# Patient Record
Sex: Female | Born: 1945 | Race: Black or African American | Hispanic: No | Marital: Single
Health system: Southern US, Community
[De-identification: ages and names within clinical notes are randomized; demographics above are authoritative.]

## PROBLEM LIST (undated history)

## (undated) DIAGNOSIS — N184 Chronic kidney disease, stage 4 (severe): Secondary | ICD-10-CM

## (undated) DIAGNOSIS — D638 Anemia in other chronic diseases classified elsewhere: Secondary | ICD-10-CM

## (undated) DIAGNOSIS — I10 Essential (primary) hypertension: Secondary | ICD-10-CM

## (undated) DIAGNOSIS — F32A Depression, unspecified: Secondary | ICD-10-CM

## (undated) DIAGNOSIS — F329 Major depressive disorder, single episode, unspecified: Secondary | ICD-10-CM

## (undated) DIAGNOSIS — I639 Cerebral infarction, unspecified: Secondary | ICD-10-CM

## (undated) DIAGNOSIS — G40909 Epilepsy, unspecified, not intractable, without status epilepticus: Secondary | ICD-10-CM

## (undated) DIAGNOSIS — E785 Hyperlipidemia, unspecified: Secondary | ICD-10-CM

## (undated) DIAGNOSIS — F0390 Unspecified dementia without behavioral disturbance: Secondary | ICD-10-CM

## (undated) DIAGNOSIS — Z950 Presence of cardiac pacemaker: Secondary | ICD-10-CM

## (undated) DIAGNOSIS — K219 Gastro-esophageal reflux disease without esophagitis: Secondary | ICD-10-CM

## (undated) DIAGNOSIS — G309 Alzheimer's disease, unspecified: Secondary | ICD-10-CM

## (undated) DIAGNOSIS — I4891 Unspecified atrial fibrillation: Secondary | ICD-10-CM

## (undated) DIAGNOSIS — F0280 Dementia in other diseases classified elsewhere without behavioral disturbance: Secondary | ICD-10-CM

## (undated) DIAGNOSIS — S72009A Fracture of unspecified part of neck of unspecified femur, initial encounter for closed fracture: Secondary | ICD-10-CM

## (undated) DIAGNOSIS — I509 Heart failure, unspecified: Secondary | ICD-10-CM

---

## 2017-11-19 ENCOUNTER — Encounter (HOSPITAL_COMMUNITY): Payer: Self-pay | Admitting: Radiology

## 2017-11-19 ENCOUNTER — Emergency Department (HOSPITAL_COMMUNITY): Payer: Medicare Other

## 2017-11-19 ENCOUNTER — Inpatient Hospital Stay (HOSPITAL_COMMUNITY)
Admission: EM | Admit: 2017-11-19 | Discharge: 2017-11-22 | DRG: 682 | Disposition: A | Discharge status: Skilled Nursing Facility | Payer: Medicare Other | Attending: Family Medicine | Admitting: Family Medicine

## 2017-11-19 DIAGNOSIS — R1310 Dysphagia, unspecified: Secondary | ICD-10-CM | POA: Diagnosis present

## 2017-11-19 DIAGNOSIS — R29810 Facial weakness: Secondary | ICD-10-CM | POA: Diagnosis not present

## 2017-11-19 DIAGNOSIS — D61818 Other pancytopenia: Secondary | ICD-10-CM | POA: Diagnosis not present

## 2017-11-19 DIAGNOSIS — N184 Chronic kidney disease, stage 4 (severe): Secondary | ICD-10-CM | POA: Diagnosis present

## 2017-11-19 DIAGNOSIS — D631 Anemia in chronic kidney disease: Secondary | ICD-10-CM | POA: Diagnosis not present

## 2017-11-19 DIAGNOSIS — L89892 Pressure ulcer of other site, stage 2: Secondary | ICD-10-CM | POA: Diagnosis present

## 2017-11-19 DIAGNOSIS — F329 Major depressive disorder, single episode, unspecified: Secondary | ICD-10-CM | POA: Diagnosis present

## 2017-11-19 DIAGNOSIS — Z886 Allergy status to analgesic agent status: Secondary | ICD-10-CM

## 2017-11-19 DIAGNOSIS — G40909 Epilepsy, unspecified, not intractable, without status epilepticus: Secondary | ICD-10-CM | POA: Diagnosis not present

## 2017-11-19 DIAGNOSIS — R9401 Abnormal electroencephalogram [EEG]: Secondary | ICD-10-CM | POA: Diagnosis present

## 2017-11-19 DIAGNOSIS — I248 Other forms of acute ischemic heart disease: Secondary | ICD-10-CM | POA: Diagnosis not present

## 2017-11-19 DIAGNOSIS — N179 Acute kidney failure, unspecified: Secondary | ICD-10-CM | POA: Diagnosis not present

## 2017-11-19 DIAGNOSIS — I69359 Hemiplegia and hemiparesis following cerebral infarction affecting unspecified side: Secondary | ICD-10-CM

## 2017-11-19 DIAGNOSIS — D7589 Other specified diseases of blood and blood-forming organs: Secondary | ICD-10-CM | POA: Diagnosis not present

## 2017-11-19 DIAGNOSIS — Z95810 Presence of automatic (implantable) cardiac defibrillator: Secondary | ICD-10-CM

## 2017-11-19 DIAGNOSIS — E860 Dehydration: Secondary | ICD-10-CM | POA: Diagnosis present

## 2017-11-19 DIAGNOSIS — E785 Hyperlipidemia, unspecified: Secondary | ICD-10-CM | POA: Diagnosis present

## 2017-11-19 DIAGNOSIS — Z96641 Presence of right artificial hip joint: Secondary | ICD-10-CM | POA: Diagnosis present

## 2017-11-19 DIAGNOSIS — I2510 Atherosclerotic heart disease of native coronary artery without angina pectoris: Secondary | ICD-10-CM | POA: Diagnosis present

## 2017-11-19 DIAGNOSIS — F0280 Dementia in other diseases classified elsewhere without behavioral disturbance: Secondary | ICD-10-CM | POA: Diagnosis present

## 2017-11-19 DIAGNOSIS — R4182 Altered mental status, unspecified: Secondary | ICD-10-CM | POA: Diagnosis present

## 2017-11-19 DIAGNOSIS — R4701 Aphasia: Secondary | ICD-10-CM | POA: Diagnosis present

## 2017-11-19 DIAGNOSIS — S72001A Fracture of unspecified part of neck of right femur, initial encounter for closed fracture: Secondary | ICD-10-CM | POA: Diagnosis not present

## 2017-11-19 DIAGNOSIS — R410 Disorientation, unspecified: Secondary | ICD-10-CM

## 2017-11-19 DIAGNOSIS — Z79899 Other long term (current) drug therapy: Secondary | ICD-10-CM

## 2017-11-19 DIAGNOSIS — I878 Other specified disorders of veins: Secondary | ICD-10-CM | POA: Diagnosis present

## 2017-11-19 DIAGNOSIS — I482 Chronic atrial fibrillation: Secondary | ICD-10-CM | POA: Diagnosis present

## 2017-11-19 DIAGNOSIS — I5020 Unspecified systolic (congestive) heart failure: Secondary | ICD-10-CM | POA: Diagnosis present

## 2017-11-19 DIAGNOSIS — R402433 Glasgow coma scale score 3-8, at hospital admission: Secondary | ICD-10-CM | POA: Diagnosis present

## 2017-11-19 DIAGNOSIS — R29719 NIHSS score 19: Secondary | ICD-10-CM | POA: Diagnosis present

## 2017-11-19 DIAGNOSIS — G92 Toxic encephalopathy: Secondary | ICD-10-CM | POA: Diagnosis not present

## 2017-11-19 DIAGNOSIS — I130 Hypertensive heart and chronic kidney disease with heart failure and stage 1 through stage 4 chronic kidney disease, or unspecified chronic kidney disease: Secondary | ICD-10-CM | POA: Diagnosis present

## 2017-11-19 DIAGNOSIS — X58XXXA Exposure to other specified factors, initial encounter: Secondary | ICD-10-CM | POA: Diagnosis not present

## 2017-11-19 DIAGNOSIS — G309 Alzheimer's disease, unspecified: Secondary | ICD-10-CM | POA: Diagnosis present

## 2017-11-19 DIAGNOSIS — T4275XA Adverse effect of unspecified antiepileptic and sedative-hypnotic drugs, initial encounter: Secondary | ICD-10-CM | POA: Diagnosis not present

## 2017-11-19 DIAGNOSIS — R69 Illness, unspecified: Secondary | ICD-10-CM

## 2017-11-19 DIAGNOSIS — G9340 Encephalopathy, unspecified: Secondary | ICD-10-CM | POA: Diagnosis present

## 2017-11-19 HISTORY — DX: Cerebral infarction, unspecified: I63.9

## 2017-11-19 HISTORY — DX: Unspecified dementia, unspecified severity, without behavioral disturbance, psychotic disturbance, mood disturbance, and anxiety: F03.90

## 2017-11-19 HISTORY — DX: Epilepsy, unspecified, not intractable, without status epilepticus: G40.909

## 2017-11-19 HISTORY — DX: Alzheimer's disease, unspecified: G30.9

## 2017-11-19 HISTORY — DX: Chronic kidney disease, stage 4 (severe): N18.4

## 2017-11-19 HISTORY — DX: Hyperlipidemia, unspecified: E78.5

## 2017-11-19 HISTORY — DX: Heart failure, unspecified: I50.9

## 2017-11-19 HISTORY — DX: Depression, unspecified: F32.A

## 2017-11-19 HISTORY — DX: Anemia in other chronic diseases classified elsewhere: D63.8

## 2017-11-19 HISTORY — DX: Dementia in other diseases classified elsewhere, unspecified severity, without behavioral disturbance, psychotic disturbance, mood disturbance, and anxiety: F02.80

## 2017-11-19 HISTORY — DX: Essential (primary) hypertension: I10

## 2017-11-19 HISTORY — DX: Unspecified atrial fibrillation: I48.91

## 2017-11-19 HISTORY — DX: Gastro-esophageal reflux disease without esophagitis: K21.9

## 2017-11-19 HISTORY — DX: Fracture of unspecified part of neck of unspecified femur, initial encounter for closed fracture: S72.009A

## 2017-11-19 HISTORY — DX: Major depressive disorder, single episode, unspecified: F32.9

## 2017-11-19 HISTORY — DX: Presence of cardiac pacemaker: Z95.0

## 2017-11-19 LAB — URINALYSIS, ROUTINE W REFLEX MICROSCOPIC
Bilirubin Urine: NEGATIVE
GLUCOSE, UA: NEGATIVE mg/dL
HGB URINE DIPSTICK: NEGATIVE
Ketones, ur: NEGATIVE mg/dL
Leukocytes, UA: NEGATIVE
Nitrite: NEGATIVE
PROTEIN: NEGATIVE mg/dL
Specific Gravity, Urine: 1.009 (ref 1.005–1.030)
pH: 6 (ref 5.0–8.0)

## 2017-11-19 LAB — CBC
HCT: 28.1 % — ABNORMAL LOW (ref 36.0–46.0)
HEMOGLOBIN: 8.6 g/dL — AB (ref 12.0–15.0)
MCH: 31.9 pg (ref 26.0–34.0)
MCHC: 30.6 g/dL (ref 30.0–36.0)
MCV: 104.1 fL — AB (ref 78.0–100.0)
Platelets: 183 10*3/uL (ref 150–400)
RBC: 2.7 MIL/uL — ABNORMAL LOW (ref 3.87–5.11)
RDW: 17.3 % — AB (ref 11.5–15.5)
WBC: 7.5 10*3/uL (ref 4.0–10.5)

## 2017-11-19 LAB — I-STAT CG4 LACTIC ACID, ED: LACTIC ACID, VENOUS: 1.41 mmol/L (ref 0.5–1.9)

## 2017-11-19 LAB — CBG MONITORING, ED: Glucose-Capillary: 92 mg/dL (ref 65–99)

## 2017-11-19 LAB — COMPREHENSIVE METABOLIC PANEL
ALBUMIN: 2.1 g/dL — AB (ref 3.5–5.0)
ALT: 15 U/L (ref 14–54)
ANION GAP: 10 (ref 5–15)
AST: 27 U/L (ref 15–41)
Alkaline Phosphatase: 115 U/L (ref 38–126)
BILIRUBIN TOTAL: 0.9 mg/dL (ref 0.3–1.2)
BUN: 33 mg/dL — AB (ref 6–20)
CO2: 27 mmol/L (ref 22–32)
Calcium: 8.2 mg/dL — ABNORMAL LOW (ref 8.9–10.3)
Chloride: 105 mmol/L (ref 101–111)
Creatinine, Ser: 2.21 mg/dL — ABNORMAL HIGH (ref 0.44–1.00)
GFR calc Af Amer: 24 mL/min — ABNORMAL LOW (ref >60)
GFR calc non Af Amer: 21 mL/min — ABNORMAL LOW (ref >60)
GLUCOSE: 98 mg/dL (ref 65–99)
POTASSIUM: 4.9 mmol/L (ref 3.5–5.1)
Sodium: 142 mmol/L (ref 135–145)
TOTAL PROTEIN: 5.8 g/dL — AB (ref 6.5–8.1)

## 2017-11-19 LAB — I-STAT VENOUS BLOOD GAS, ED
Acid-Base Excess: 7 mmol/L — ABNORMAL HIGH (ref 0.0–2.0)
Bicarbonate: 32.5 mmol/L — ABNORMAL HIGH (ref 20.0–28.0)
O2 SAT: 68 %
TCO2: 34 mmol/L — AB (ref 22–32)
pCO2, Ven: 50.4 mmHg (ref 44.0–60.0)
pH, Ven: 7.418 (ref 7.250–7.430)
pO2, Ven: 36 mmHg (ref 32.0–45.0)

## 2017-11-19 LAB — AMMONIA: Ammonia: 20 umol/L (ref 9–35)

## 2017-11-19 LAB — PROTIME-INR
INR: 1.21
PROTHROMBIN TIME: 15.2 s (ref 11.4–15.2)

## 2017-11-19 LAB — PHOSPHORUS: Phosphorus: 5.2 mg/dL — ABNORMAL HIGH (ref 2.5–4.6)

## 2017-11-19 LAB — MAGNESIUM: Magnesium: 2.1 mg/dL (ref 1.7–2.4)

## 2017-11-19 LAB — BRAIN NATRIURETIC PEPTIDE: B NATRIURETIC PEPTIDE 5: 44.2 pg/mL (ref 0.0–100.0)

## 2017-11-19 MED ORDER — SODIUM CHLORIDE 0.9 % IV SOLN
Freq: Once | INTRAVENOUS | Status: AC
  Administered 2017-11-19: 21:00:00 via INTRAVENOUS

## 2017-11-19 MED ORDER — SODIUM CHLORIDE 0.9 % IV SOLN
500.0000 mg | Freq: Once | INTRAVENOUS | Status: AC
  Administered 2017-11-19: 500 mg via INTRAVENOUS
  Filled 2017-11-19: qty 5

## 2017-11-19 NOTE — ED Notes (Signed)
Felton ClintonEileen Weston, pt NP at [facility], called for an update on pt. States pt is normally alert and oriented to person and place, and speech is normally clear. States pt has been off of xarelto since November 01, 2017, and she is concerned for stroke. Made aware that pt has not yet been to CT and to check back in approx 90 minutes once CT should be resulted.

## 2017-11-19 NOTE — H&P (Signed)
[facility] Medicine Teaching [facility] History and Physical Service Pager: [phone]  Patient name: Alexandra Potts Medical record number: [id number] Date of birth: 12/06/1945 Age: 72 y.o. Gender: female  Primary Care Provider: Patient, No Pcp Per Consultants: Neurology Code Status: Full  Chief Complaint: Altered mental status  Assessment and Plan: Alexandra Potts is a 72 y.o. female presenting with altered mental status from her nursing home.  PMH is significant for right hip fracture status post s/p right hip arthroplasty 11/02/17, CAD, angina pectoralis, CKD 4, Alzheimer's disease, CHF w/ biventricular ICD, hypertension, pancytopenia, atrial fibrillation, pancytopenia, epilepsy, CVA of MCA with hemiplegia  Altered mental status: Patient was more confused from baseline this a.m at [facility] and was brought in by EMS to the ED for evaluation.  Labs obtained in ED, include CMP, BNP, VBG, CBC, PT/INR, UA, chest x-ray, blood cultures x2 and urine culture, CT head, EKG, lactic acid, ammonia.  VBG was non-acidotic with normal PCO2 and PO2.  CMP had elevated creatinine to 2.21, albumin 2.1, total protein 5.8, phosphorus 5.2 with remainder of CMP unremarkable.  Lactic acid was 1.4.  BNP 44.2.  CBC had WBC 7.5, hemoglobin 8.6 with MVC of 104.  PT/INR is 15.2/1.21.  Blood glucose was 98.  UA was clear and negative for ketones, nitrites, leukocytes.  Ammonia was within normal levels at 20.  CT head showed prior MCA stroke without evidence of new stroke.  EKG has significant artifact, likely due to patient's significant obesity to appreciate any interpretable waveforms.  This is consistent on repeat EKG.  Chest x-ray shows low lung volumes and pacer/defibrillator.  There is no evidence of pneumonia on read by radiology. Patient has history of stroke, Alzheimer's dementia.  It is difficult to ascertain her baseline as it is not clearly documented.  Patient is oriented to self only.  She can answer yes  or no questions, but appears global delay in mentation.  Patient's baseline is not well documented.  She has history significant for Alzheimer's, prior stroke with hemiplegia, epilepsy.  Differential diagnosis includes stroke, toxic metabolic encephalopathy, seizure, dementia.  Infection does not seem to be likely given no white count, no obvious source seen on chest x-ray or found UA.  Although no stroke was seen on CT, stroke is very possible given patient has history of A. fib and is not currently anticoagulated due to recent hip fracture.  Neurology recommended MRI versus repeat head CT in 24 hours.  MRI is not obtainable at this time as patient has an AICD in which we do not know MRI compatibility.  Cardiac etiology such as A. fib, worsening CHF, MI are possible given patient's significant cardiac history.  CHF is not likely given low BNP, patient is down 9 pounds from prior discharge on 0 1/31, lack of volume overloaded on chest x-ray.  Nevertheless, patient has very significant lower extremity edema. Cannot rule out MI based on EKG.  Endocrine etiology is also possible will obtain TSH.  Will obtain reversible dementia labs including B12, folate, RPR, HIV.  Intoxication is also possible, will obtain ethanol levels, and UDS. Leading differentials include stroke vs seizure; less likely infectious etiology.  -Admit to family medicine teaching service, attending Dr. Pollie MeyerMcIntyre - Vitals per floor protocol -Neurology consulted, appreciate recommendations -Telemetry -Echocardiogram -Stratification labs: A1c, cholesterol -TSH -B12, folate -RPR, HIV -Blood cultures, urine cultures pending -IV Keppra 250 mg every 12 hours, convert back to p.o. when passes swallow study -Restart Vimpat 100 mg twice daily when patient  can tolerate p.o. -EEG in the a.m. -Follow-up with primary care physician to determine type of ICD ([employer]'s?) -MRI versus CT pending determining type of ICD compatibility -Daily  weights -Strict I's and O's -Continuous pulse ox -Neurochecks every 2 for 12 hours, then q.4 hours -Vital signs per floor -PT/OT/speech -Troponin, repeat EKG in a.m. -Morning BMP, CBC -Ethanol, UDS -Hold sedating meds while altered - Can try and touch base with [facility] nursing facility where patient came from tomorrow to get an idea of baseline mental status  Right hip fracture of right femoral neck: Status post s/p right hip arthroplasty 11/02/17, Discharge from hospital with non weight bearing and orthotic.Patient was to complete 7 day course of doxycycline for drainage from incision site.  Not on current med list.  Unsure if patient finished.  Patient was anticoagulated with Xarelto for atrial fibrillation, however this was discontinued upon discharge of her last hospitalization until she was cleared by outpatient orthopedics. On percocet at home 5-3 25 -Consider consult orthopedics vs waiting to follow up outpatient -Consult [employer] to repeat position patient in custom brace.   -Continue percocet 5 mg BID PRN  AKI on CKD stage: Likely due to worsening cardiorenal syndrome due to CHF, may be worse due to stress of current illness question. Additionally could be prerenal etiology from dehydration (weight is down 9 lbs from January). Cr 2.27 on admission, was 1.31 on 1/31. Given gentle fluid hydration and ED of 700 cc. -continue to monitor BUN and creatinine -restart torsemide  -Holding IV fluids for now  Systolic CHF : Echocardiogram on 10/19/17shows EF 51%.  BNP on admission does not support active exacerbation.  At home, patient is on on torsemide 40 mg, carvedilol 12.5 mg twice daily, weight on discharge was 372 pounds. Weight on admission is 363.  Patient is down 9 pounds -Restart torsemide and carvedilol  -strict I's and O's, daily weights  Hypertension, chronic: Uncontrolled on admission to 143/128. Takes amlodipine 5 mg daily, hydralazine 12.5 mg twice daily, lisinopril 2.5  mg -Resume home medications  History of dysphagia: Previously eval by speech pathology and advanced to chopped diet.  Given patient's AMS.   -Will consult speech further recommendations. - Dysphagia 1 diet for now if she passes stroke swallow screen  Epilepsy, chronic:Takes Keppra 250 mg twice daily, Vimpat 100 mg twice daily. S/p Keppra load per neurology -IV Keppra as above until passes stroke swallow screen and can change to PO - Continue Vimpat 100 mg BID - Neurology consulted as above  A fib, chronic, s/p pacer/AICD:  -Rate control amiodarone 200 mg.   -Holding anticoagulation with Xarelto 15 mg daily  CAD-chronic:,  -Consider increasing atorvastatin to 40 mg -Continue with statin and Imdur   Hx of CVAwith residual hemiplegia. -Has aspirin allergy.  We will not initiate.  Will hold Xarelto as above. -Home atorvastatin 20 mg,consider increasing  Chronic anemia with pancytopenia: Hemoglobin on admission was 8.6.  Hemoglobin 7.8 2/6.  Patient had ALBA status post surgery.  This was likely contributing to current low.  Patient has macrocytosis on admission.  Also has history of iron deficiency anemia.  She is on 325 mg of iron daily at home.  Consider anemia panel this hospitalization.  Obtaining B12 and folate.  -Trend hemoglobin and platelets  - Continue home iron - Transfusion threshold hgb <8 due to CAD   History of pressure ulcerinner thigh RLE, POA, stage 1-2. Does not observe pressure ulcer on lower extremities.  Lower extremities are consistent with  significant chronic venous stasis -Wound care consult  FEN/GI: N.p.o. Prophylaxis: Lovenox for DVT prophylaxis  Disposition: Inpatient admission pending medical improvement  History of Present Illness:  Alexandra Potts is a 72 y.o. female presenting with AMS from her nursing home at [facility]. Patient does not know why she is in the ED. Patient does not provide very much history secondary to poor speech Per report from  EMS, patient was more confused from baseline this a.m. She was brought into the ED by EMS.  On presentation, patient was not oriented to self or place.  She was mildly hypotensive at 132/99 so gentle fluid resuscitation was started.  Otherwise, vitals were stable.  Broad workup for altered mental status was initiated in the ED looking at cardiac, infectious, CNS etiologies.  Neurology was also consulted.  Given patient's history of seizure, neurology recommended loading patient with IV Keppra 500 mg.  Later evaluation patient by ED and by our team,   Review Of Systems: Per HPI with the following additions: Denies pain.  Review of Systems  Unable to perform ROS: Other (Altered mental status)    Patient Active Problem List   Diagnosis Date Noted  . Altered mental status 11/19/2017    Past Medical History: Past Medical History:  Diagnosis Date  . Alzheimer's disease   . Anemia of chronic disease   . Atrial fibrillation (HCC)   . CHF (congestive heart failure) (HCC)   . CKD (chronic kidney disease), stage IV (HCC)   . Dementia   . Depression   . Epilepsy (HCC)   . GERD (gastroesophageal reflux disease)   . Hip fracture (HCC)   . Hyperlipidemia   . Hypertension   . Pacemaker   . Stroke [facility])     Past Surgical History: History of implanted pacemaker/defibrillator History of right femur fixation  Social History: Social History   Tobacco Use  . Smoking status: Not on file  Substance Use Topics  . Alcohol use: Not on file  . Drug use: Not on file   Additional social history: Lives at [facility] nursing home. Please also refer to relevant sections of EMR.  Family History: No family history on file. Unable to obtain due to exam  Allergies and Medications: Allergies  Allergen Reactions  . Aspirin Other (See Comments)    Unknown reaction - listed on MAR   No current facility-administered medications on file prior to encounter.    Current Outpatient Medications on File  Prior to Encounter  Medication Sig Dispense Refill  . acetaminophen (TYLENOL) 325 MG tablet Take 650 mg by mouth every 4 (four) hours as needed for fever (pain).    . Amino Acids-Protein Hydrolys (FEEDING SUPPLEMENT, PRO-STAT SUGAR FREE 64,) LIQD Take 30 mLs by mouth 2 (two) times daily with a meal.     . amiodarone (PACERONE) 200 MG tablet Take 200 mg by mouth 2 (two) times daily.    Marland Kitchen amLODipine (NORVASC) 5 MG tablet Take 5 mg by mouth daily.    Marland Kitchen atorvastatin (LIPITOR) 20 MG tablet Take 20 mg by mouth at bedtime.    . bisacodyl (DULCOLAX) 5 MG EC tablet Take 10 mg by mouth daily as needed (constipation).    . Calcium Carbonate-Vitamin D3 (CALCIUM 600-D) 600-400 MG-UNIT TABS Take 1 tablet by mouth 2 (two) times daily.    . carvedilol (COREG) 12.5 MG tablet Take 12.5 mg by mouth 2 (two) times daily with a meal.    . Cholecalciferol (VITAMIN D3 ULTRA POTENCY) 78469 units TABS  Take 50,000 Units by mouth every 30 (thirty) days. On or about the 5th of each month    . Coenzyme Q10 (COQ10) 50 MG CAPS Take 50 mg by mouth at bedtime.     . docusate sodium (COLACE) 100 MG capsule Take 100 mg by mouth 2 (two) times daily.    . ferrous sulfate 325 (65 FE) MG tablet Take 325 mg by mouth daily.    . hydrALAZINE (APRESOLINE) 25 MG tablet Take 12.5 mg by mouth 2 (two) times daily.    . isosorbide dinitrate (ISORDIL) 20 MG tablet Take 20 mg by mouth 3 (three) times daily.    . Lacosamide (VIMPAT) 100 MG TABS Take 100 mg by mouth 2 (two) times daily.    Marland Kitchen levETIRAcetam (KEPPRA) 250 MG tablet Take 250 mg by mouth 2 (two) times daily. For epilepsy    . lisinopril (PRINIVIL,ZESTRIL) 2.5 MG tablet Take 2.5 mg by mouth daily.    . magnesium oxide (MAG-OX) 400 MG tablet Take 400 mg by mouth at bedtime.    . Multiple Vitamin (MULTIVITAMIN WITH MINERALS) TABS tablet Take 1 tablet by mouth daily.    . nitroGLYCERIN (NITROSTAT) 0.4 MG SL tablet Place 0.4 mg under the tongue every 5 (five) minutes as needed for chest pain  (max 3 doses, if no relief call MD).    Marland Kitchen oxyCODONE-acetaminophen (PERCOCET/ROXICET) 5-325 MG tablet Take 1 tablet by mouth See admin instructions. Give 1 tablet by mouth twice daily for pain management for two weeks - 11/15/17 - 11/29/17    . Petrolatum-Zinc Oxide (PHYTOPLEX Z-GUARD) 57-17 % PSTE Apply 1 application topically See admin instructions. Apply to butt, bilateral thighs topically every day and evening shift for abrasions. Cleanse buttocks and bilateral thighs. Pat Dry. Apply Z-guard to these areas. Do this until healed, then D/C.    Marland Kitchen Polyethyl Glycol-Propyl Glycol (SYSTANE) 0.4-0.3 % SOLN Place 1 drop into both eyes daily.    . potassium chloride SA (K-DUR,KLOR-CON) 20 MEQ tablet Take 20 mEq by mouth daily.    . pregabalin (LYRICA) 25 MG capsule Take 25 mg by mouth daily.    . sertraline (ZOLOFT) 25 MG tablet Take 25 mg by mouth daily.    . Skin Protectants, Misc. (EUCERIN) cream Apply 1 application topically See admin instructions. Apply to RLE and LLE topically every day shift for dry skin    . torsemide (DEMADEX) 20 MG tablet Take 40 mg by mouth daily.    . vitamin C (ASCORBIC ACID) 500 MG tablet Take 500 mg by mouth daily.      Objective: BP (!) 143/128 (BP Location: Left Arm)   Pulse 62   Temp 98.6 F (37 C) (Oral)   Resp 16   Wt (!) 363 lb 15.7 oz (165.1 kg)   SpO2 100%  Exam: General: Alert, appears confused, morbid obesity Eyes: Pupils appear myopic, reactive to light, extraocular motions appear intact although unable to follow exam Neck: No JVD, supple Cardiovascular: Distant heart sounds, regular rate, 2-3+ pitting edema from knees down, 1+ edema in bilateral thighs, cannot palpate DP pulses but can hear on doppler in ED Respiratory: Normal work of breathing, lungs clear to auscultation bilaterally Gastrointestinal: Large central pannus, no tenderness appreciated on exam MSK: Moves upper extremities spontaneously. right leg feels mildly warm compared to left extremity,  chronic venous stasis with woody skin changes, no observed open lesions Neuro: Oriented to self, able to answer yes or no, 5/5 strength in upper extremities, cannot elicit sensation in  lower extremities cannot elicit patient to cooperate with exam lower extremities, appears to have history of hemiplegia from prior stroke as documented in chart, mild tremor head  Labs and Imaging: CBC BMET  Recent Labs  Lab 11/20/17 0135  WBC 8.0  HGB 9.0*  HCT 30.4*  PLT 170   Recent Labs  Lab 11/19/17 1455  NA 142  K 4.9  CL 105  CO2 27  BUN 33*  CREATININE 2.21*  GLUCOSE 98  CALCIUM 8.2Garnette Gunner, MD 11/20/2017, 3:17 AM PGY-1, [place] FPTS Intern pager: [phone], text pages welcome  I have separately seen and examined the patient. I have discussed the findings and exam with Dr. Janee Morn and agree with the above note.  My changes/additions are outlined in BLUE.   Anders Simmonds, MD [facility], PGY-3

## 2017-11-19 NOTE — Consult Note (Signed)
Neurology Consultation  Reason for Consult: Altered mental status Referring Physician: Dr. Clarice Pole  CC: Altered mental status  History is obtained from: Chart review  HPI: Alexandra Potts is a 72 y.o. female who has a past medical history of old strokes with unclear residual deficits, closed displaced fracture of the right femur, dementia, hypertension, atrial fibrillation for which she was on Xarelto that was on hold for her hip surgery, biventricular AICD placement, who was brought in from a facility for increased altered mental status.  According to chart review, there is unclear documentation of patient's baseline but she was possibly verbal, not oriented but now became increasingly confused with garbled speech. Upon arrival at [facility], she was hypotensive, received a 700 cc normal saline bolus and her blood pressure was then recorded to be 122/83.  Recent hip replacement done on 11/12/2017. There is another note in the chart that says that 1 of the providers called the facility for update which said that patient is normally alert and oriented to person and place and her speech is normally clear.  She has been off of Xarelto since November 01, 2017.  She has a stroke in the past but no documentation of residual deficits. Of note, patient is on Keppra and Vimpat.  Keppra 250 twice daily and Vimpat 100 twice daily.  Unclear of her seizure semiology. No witnessed seizure activity per chart review.  She is currently not exhibiting any clinical tonic-clonic either generalized or focal seizure activity.  LKW: Greater than 24 hours from presentation tpa given?: no, outside the window Premorbid modified Rankin scale (mRS):5  ROS: Unable to obtain due to altered mental status.   Past medical history-based on chart review Congestive heart failure-unspecified Chronic renal impairment stage IV Coronary artery disease Biventricular ICD placement Hypertension Stroke with unknown  residual deficits Seizure Dementia  No family history on file-patient unable to provide  Social History:   has no tobacco, alcohol, and drug history on file. Patient unable to provide  Medications No current facility-administered medications for this encounter.   Current Outpatient Medications:  .  acetaminophen (TYLENOL) 325 MG tablet, Take 650 mg by mouth every 4 (four) hours as needed for fever (pain)., Disp: , Rfl:  .  Amino Acids-Protein Hydrolys (FEEDING SUPPLEMENT, PRO-STAT SUGAR FREE 64,) LIQD, Take 30 mLs by mouth 2 (two) times daily with a meal. , Disp: , Rfl:  .  amiodarone (PACERONE) 200 MG tablet, Take 200 mg by mouth 2 (two) times daily., Disp: , Rfl:  .  amLODipine (NORVASC) 5 MG tablet, Take 5 mg by mouth daily., Disp: , Rfl:  .  atorvastatin (LIPITOR) 20 MG tablet, Take 20 mg by mouth at bedtime., Disp: , Rfl:  .  bisacodyl (DULCOLAX) 5 MG EC tablet, Take 10 mg by mouth daily as needed (constipation)., Disp: , Rfl:  .  Calcium Carbonate-Vitamin D3 (CALCIUM 600-D) 600-400 MG-UNIT TABS, Take 1 tablet by mouth 2 (two) times daily., Disp: , Rfl:  .  carvedilol (COREG) 12.5 MG tablet, Take 12.5 mg by mouth 2 (two) times daily with a meal., Disp: , Rfl:  .  Cholecalciferol (VITAMIN D3 ULTRA POTENCY) 16109 units TABS, Take 50,000 Units by mouth every 30 (thirty) days. On or about the 5th of each month, Disp: , Rfl:  .  Coenzyme Q10 (COQ10) 50 MG CAPS, Take 50 mg by mouth at bedtime. , Disp: , Rfl:  .  docusate sodium (COLACE) 100 MG capsule, Take 100 mg by mouth 2 (  two) times daily., Disp: , Rfl:  .  ferrous sulfate 325 (65 FE) MG tablet, Take 325 mg by mouth daily., Disp: , Rfl:  .  hydrALAZINE (APRESOLINE) 25 MG tablet, Take 12.5 mg by mouth 2 (two) times daily., Disp: , Rfl:  .  isosorbide dinitrate (ISORDIL) 20 MG tablet, Take 20 mg by mouth 3 (three) times daily., Disp: , Rfl:  .  Lacosamide (VIMPAT) 100 MG TABS, Take 100 mg by mouth 2 (two) times daily., Disp: , Rfl:  .   levETIRAcetam (KEPPRA) 250 MG tablet, Take 250 mg by mouth 2 (two) times daily. For epilepsy, Disp: , Rfl:  .  lisinopril (PRINIVIL,ZESTRIL) 2.5 MG tablet, Take 2.5 mg by mouth daily., Disp: , Rfl:  .  magnesium oxide (MAG-OX) 400 MG tablet, Take 400 mg by mouth at bedtime., Disp: , Rfl:  .  Multiple Vitamin (MULTIVITAMIN WITH MINERALS) TABS tablet, Take 1 tablet by mouth daily., Disp: , Rfl:  .  nitroGLYCERIN (NITROSTAT) 0.4 MG SL tablet, Place 0.4 mg under the tongue every 5 (five) minutes as needed for chest pain (max 3 doses, if no relief call MD)., Disp: , Rfl:  .  oxyCODONE-acetaminophen (PERCOCET/ROXICET) 5-325 MG tablet, Take 1 tablet by mouth See admin instructions. Give 1 tablet by mouth twice daily for pain management for two weeks - 11/15/17 - 11/29/17, Disp: , Rfl:  .  Petrolatum-Zinc Oxide (PHYTOPLEX Z-GUARD) 57-17 % PSTE, Apply 1 application topically See admin instructions. Apply to butt, bilateral thighs topically every day and evening shift for abrasions. Cleanse buttocks and bilateral thighs. Pat Dry. Apply Z-guard to these areas. Do this until healed, then D/C., Disp: , Rfl:  .  Polyethyl Glycol-Propyl Glycol (SYSTANE) 0.4-0.3 % SOLN, Place 1 drop into both eyes daily., Disp: , Rfl:  .  potassium chloride SA (K-DUR,KLOR-CON) 20 MEQ tablet, Take 20 mEq by mouth daily., Disp: , Rfl:  .  pregabalin (LYRICA) 25 MG capsule, Take 25 mg by mouth daily., Disp: , Rfl:  .  sertraline (ZOLOFT) 25 MG tablet, Take 25 mg by mouth daily., Disp: , Rfl:  .  Skin Protectants, Misc. (EUCERIN) cream, Apply 1 application topically See admin instructions. Apply to RLE and LLE topically every day shift for dry skin, Disp: , Rfl:  .  torsemide (DEMADEX) 20 MG tablet, Take 40 mg by mouth daily., Disp: , Rfl:  .  vitamin C (ASCORBIC ACID) 500 MG tablet, Take 500 mg by mouth daily., Disp: , Rfl:   Exam: Current vital signs: BP 137/76   Pulse 64   Resp 14   SpO2 100%  Vital signs in last 24 hours: Pulse  Rate:  [64-68] 64 (02/08 1830) Resp:  [9-18] 14 (02/08 1830) BP: (98-137)/(75-99) 137/76 (02/08 1830) SpO2:  [100 %] 100 % (02/08 1830) General: Morbidly obese, not very well kempt, is awake, alert appears in no distress HEENT: Normal cephalic, atraumatic, dry mucous membranes No thyromegaly Lungs: Distant breath sounds but moving air symmetrically bilaterally Cardia vascular: S1-S2 heard regular rate rhythm no murmur rub gallop Musculoskeletal: Extensively puckered skin in both lower extremities with extensive discoloration distally, 2+ pitting edema bilaterally Abdomen: Obese, nontender on palpation Neurological exam patient is awake, appears alert, does not follow any commands. Her speech is completely incomprehensible Cranial nerves: Pupils equal round reactive to light, tracks in all directions with no gaze restriction, right lower facial weakness observed at rest, does not blink to threat from either side Motor exam: Appears to be moving both upper extremities  to noxious stimulus.  Does not move lower extremities at all Sensory exam: As above Coordination and gait exam cannot be performed due to patient's mentation Able to elicit any deep tendon reflexes NIHSS 1a Level of Conscious.: 0 1b LOC Questions: 2 1c LOC Commands: 2 2 Best Gaze: 0 3 Visual: 0 4 Facial Palsy: 1 5a Motor Arm - left: 2 5b Motor Arm - Right: 2 6a Motor Leg - Left: 3 6b Motor Leg - Right: 3 7 Limb Ataxia: 0 8 Sensory: 0 9 Best Language: 2 10 Dysarthria: 2 11 Extinct. and Inatten.: 0 TOTAL: 19  Labs I have reviewed labs in epic and the results pertinent to this consultation are: CBC    Component Value Date/Time   WBC 7.5 11/19/2017 1455   RBC 2.70 (L) 11/19/2017 1455   HGB 8.6 (L) 11/19/2017 1455   HCT 28.1 (L) 11/19/2017 1455   PLT 183 11/19/2017 1455   MCV 104.1 (H) 11/19/2017 1455   MCH 31.9 11/19/2017 1455   MCHC 30.6 11/19/2017 1455   RDW 17.3 (H) 11/19/2017 1455    CMP     Component  Value Date/Time   NA 142 11/19/2017 1455   K 4.9 11/19/2017 1455   CL 105 11/19/2017 1455   CO2 27 11/19/2017 1455   GLUCOSE 98 11/19/2017 1455   BUN 33 (H) 11/19/2017 1455   CREATININE 2.21 (H) 11/19/2017 1455   CALCIUM 8.2 (L) 11/19/2017 1455   PROT 5.8 (L) 11/19/2017 1455   ALBUMIN 2.1 (L) 11/19/2017 1455   AST 27 11/19/2017 1455   ALT 15 11/19/2017 1455   ALKPHOS 115 11/19/2017 1455   BILITOT 0.9 11/19/2017 1455   GFRNONAA 21 (L) 11/19/2017 1455   GFRAA 24 (L) 11/19/2017 1455  Magnesium 2.1, phosphorus 5.2, ammonia 20. Based on chart review from an office visit note from 10/19/2016, baseline creatinine around 1.1 Today the creatinine is 2.21  Imaging I have reviewed the images obtained: CT-scan of the brain-no acute changes.  Large areas of encephalomalacia probably reflective of an old stroke in the left MCA territory. Chest x-ray not revealing of pneumonia per official radiology report  Assessment:  72 year old woman brought in from a nursing facility for increased altered mental status over the past day or 2. No clear time of last known well.  She is clearly aphasic on exam. Due to her body habitus, it is very difficult to ascertain if she has any focal weakness but she does have right facial droop as well. Her head CT scan shows an old stroke affecting the left MCA territory. Her symptoms currently could reflect a new stroke versus recrudescence of old symptoms in the setting of current toxic metabolic derangements-creatinine 2.21. Another differential to consider is ongoing seizure activity as she is currently on Keppra and Vimpat for seizures. Not much in terms of her neurological history is available from the chart. She is not a candidate for IV TPA due to unknown last known normal or outside the window. She is not a candidate for endovascular treatment based on her modified Rankin score and no clear last known normal.  Impression: Evaluate for toxic metabolic  encephalopathy Evaluate for stroke Evaluate for seizures  Recommendations: I would recommend obtaining an MRI of the brain when possible and if the AICD is compatible. If the MRI is not possible, repeat a CT scan of the head in about 24 hours to look for any change compared to the prior scan. Routine EEG in the morning Aspirin for  stroke prevention for now.  Need to get more details of the surgery to ascertain the timing of medical regulation resumption. Continue with current doses of Keppra 250 twice daily and Vimpat 100 twice daily.  Give extra dose of Keppra 500 IV (renal dose). Maintain seizure precautions Check B12, TSH Neurology will follow with you  -- Milon Dikes, MD [facility] Pager: [phone] If 7pm to 7am, please call on call as listed on AMION.

## 2017-11-19 NOTE — ED Notes (Signed)
Patient transported to CT 

## 2017-11-19 NOTE — ED Provider Notes (Signed)
[facility] EMERGENCY DEPARTMENT Provider Note   CSN: [id number] Arrival date & time: 11/19/17  1447     History   Chief Complaint Chief Complaint  Patient presents with  . Altered Mental Status    HPI Alexandra Potts is a 72 y.o. female.  HPI Patient cannot give any history.  She is awake and responsive but seems confused.  She makes some verbal type responses but unintelligible.  History is obtained from EMS, nursing home report and EMR.  Patient does have documented history of Alzheimer's dementia and CVA.  At this time, difficult to ascertain patient's exact baseline mental status function.  EMR indicates during hospitalization for her hip fracture she was poorly interactive verbally and received evaluation.  Nursing home report states, "resident's baseline was alert, but confused (does not indicate baseline verbal level).  Now resident is not responding to commands, unresponsive to penlight, thrusting tongue, unable to swallow medications and food without moving around her mouth numerous times, unable to grasp her hands, unable to lift right arm or hand even with assistance cannot keep it held up.  Post surgery for right hip fracture, Xarelto on hold." History reviewed. No pertinent past medical history.  There are no active problems to display for this patient.   The histories are not reviewed yet. Please review them in the "History" navigator section and refresh this SmartLink.  OB History    No data available       Home Medications    Prior to Admission medications   Medication Sig Start Date End Date Taking? Authorizing Provider  acetaminophen (TYLENOL) 325 MG tablet Take 650 mg by mouth every 4 (four) hours as needed for fever (pain).   Yes [provider]  Amino Acids-Protein Hydrolys (FEEDING SUPPLEMENT, PRO-STAT SUGAR FREE 64,) LIQD Take 30 mLs by mouth 2 (two) times daily with a meal.    Yes [provider]  amiodarone (PACERONE)  200 MG tablet Take 200 mg by mouth 2 (two) times daily.   Yes [provider]  amLODipine (NORVASC) 5 MG tablet Take 5 mg by mouth daily.   Yes [provider]  atorvastatin (LIPITOR) 20 MG tablet Take 20 mg by mouth at bedtime.   Yes [provider]  bisacodyl (DULCOLAX) 5 MG EC tablet Take 10 mg by mouth daily as needed (constipation).   Yes [provider]  Calcium Carbonate-Vitamin D3 (CALCIUM 600-D) 600-400 MG-UNIT TABS Take 1 tablet by mouth 2 (two) times daily.   Yes [provider]  carvedilol (COREG) 12.5 MG tablet Take 12.5 mg by mouth 2 (two) times daily with a meal.   Yes [provider]  Cholecalciferol (VITAMIN D3 ULTRA POTENCY) 40981 units TABS Take 50,000 Units by mouth every 30 (thirty) days. On or about the 5th of each month   Yes [provider]  Coenzyme Q10 (COQ10) 50 MG CAPS Take 50 mg by mouth at bedtime.    Yes [provider]  docusate sodium (COLACE) 100 MG capsule Take 100 mg by mouth 2 (two) times daily.   Yes [provider]  ferrous sulfate 325 (65 FE) MG tablet Take 325 mg by mouth daily.   Yes [provider]  hydrALAZINE (APRESOLINE) 25 MG tablet Take 12.5 mg by mouth 2 (two) times daily.   Yes [provider]  isosorbide dinitrate (ISORDIL) 20 MG tablet Take 20 mg by mouth 3 (three) times daily.   Yes [provider]  Lacosamide (VIMPAT) 100  MG TABS Take 100 mg by mouth 2 (two) times daily.   Yes [provider]  levETIRAcetam (KEPPRA) 250 MG tablet Take 250 mg by mouth 2 (two) times daily. For epilepsy   Yes [provider]  lisinopril (PRINIVIL,ZESTRIL) 2.5 MG tablet Take 2.5 mg by mouth daily.   Yes [provider]  magnesium oxide (MAG-OX) 400 MG tablet Take 400 mg by mouth at bedtime.   Yes [provider]  Multiple Vitamin (MULTIVITAMIN WITH MINERALS) TABS tablet Take 1 tablet by mouth daily.   Yes [provider]  nitroGLYCERIN (NITROSTAT) 0.4 MG SL tablet Place 0.4 mg under the tongue every 5 (five) minutes as needed for chest pain (max 3 doses, if no relief call MD).   Yes [provider]  oxyCODONE-acetaminophen (PERCOCET/ROXICET) 5-325 MG tablet Take 1 tablet by mouth See admin instructions. Give 1 tablet by mouth twice daily for pain management for two weeks - 11/15/17 - 11/29/17   Yes [provider]  Petrolatum-Zinc Oxide (PHYTOPLEX Z-GUARD) 57-17 % PSTE Apply 1 application topically See admin instructions. Apply to butt, bilateral thighs topically every day and evening shift for abrasions. Cleanse buttocks and bilateral thighs. Pat Dry. Apply Z-guard to these areas. Do this until healed, then D/C.   Yes [provider]  Polyethyl Glycol-Propyl Glycol (SYSTANE) 0.4-0.3 % SOLN Place 1 drop into both eyes daily.   Yes [provider]  potassium chloride SA (K-DUR,KLOR-CON) 20 MEQ tablet Take 20 mEq by mouth daily.   Yes [provider]  pregabalin (LYRICA) 25 MG capsule Take 25 mg by mouth daily.   Yes [provider]  sertraline (ZOLOFT) 25 MG tablet Take 25 mg by mouth daily.   Yes [provider]  Skin Protectants, Misc. (EUCERIN) cream Apply 1 application topically See admin instructions. Apply to RLE and LLE topically every day shift for dry skin   Yes [provider]  torsemide (DEMADEX) 20 MG tablet Take 40 mg by mouth daily.   Yes [provider]  vitamin C (ASCORBIC ACID) 500 MG tablet Take 500 mg by mouth daily.   Yes [provider]    Family History No family history on file.  Social History Social History   Tobacco Use  . Smoking status: Not on file  Substance Use Topics  . Alcohol use: Not on file  . Drug use: Not on file     Allergies   Aspirin   Review of Systems Review of Systems 10 Systems reviewed and are negative for acute change except as noted in the HPI.  Physical  Exam Updated Vital Signs BP 137/76   Pulse 64   Resp 14   SpO2 100%   Physical Exam  Constitutional:  Patient is alert with eyes open as I enter the room.  She is making repetitive mouth movements.  No evident respiratory distress at rest.  Morbid obesity.  Pale in appearance.  HENT:  Head: Normocephalic and atraumatic.  Mucous membranes are slightly dry.  Airway patent without any sonorous respirations or difficulty handling secretions.  Edentulous.  Eyes: EOM are normal. Pupils are equal, round, and reactive to light.  Patient gazes about.  She will look directly at me.  Her eye movements are conjugate.  If I call her name she will directly gaze at me.  Neck: Neck supple.  Cardiovascular:  Distant heart sounds.  Patient has morbidly obese chest wall.  Regular.  Monitor shows regular complexes 60-67 bpm.  Pulmonary/Chest:  Patient does not have significant increased work of breathing.  She does have morbid obesity.  Breath sounds are very soft throughout.  Cannot appreciate focal deficit or gross wheeze or rhonchi.  Abdominal:  Abdomen is soft.  No apparent pain on palpation.  Musculoskeletal:  Patient has 3+ pitting edema bilateral lower extremities.  She is wearing a knee immobilizer on the right lower extremity.  Neurological:  Patient is awake and alert in appearance.  She does gaze about as stated.  If I asked her to say her name she does not respond to that.  A couple of times she made a formed "yes".  She has not following commands, per se.  She is spontaneously moving her upper extremities to hold the rails or move her hands to the midline of her body.  Somewhat questionable whether or not the left upper extremity has shown some stiffening and posturing-like position.  She however then can subsequently pick that arm up and lie at on her body.  She is making repetitive mouth motions that have lip smacking quality.  Skin: Skin is warm and dry. No pallor.  Many bruises on upper  extremities.       ED Treatments / Results  Labs (all labs ordered are listed, but only abnormal results are displayed) Labs Reviewed  COMPREHENSIVE METABOLIC PANEL - Abnormal; Notable for the following components:      Result Value   BUN 33 (*)    Creatinine, Ser 2.21 (*)    Calcium 8.2 (*)    Total Protein 5.8 (*)    Albumin 2.1 (*)    GFR calc non Af Amer 21 (*)    GFR calc Af Amer 24 (*)    All other components within normal limits  CBC - Abnormal; Notable for the following components:   RBC 2.70 (*)    Hemoglobin 8.6 (*)    HCT 28.1 (*)    MCV 104.1 (*)    RDW 17.3 (*)    All other components within normal limits  PHOSPHORUS - Abnormal; Notable for the following components:   Phosphorus 5.2 (*)    All other components within normal limits  I-STAT VENOUS BLOOD GAS, ED - Abnormal; Notable for the following components:   Bicarbonate 32.5 (*)    TCO2 34 (*)    Acid-Base Excess 7.0 (*)    All other components within normal limits  URINE CULTURE  CULTURE, BLOOD (ROUTINE X 2)  CULTURE, BLOOD (ROUTINE X 2)  AMMONIA  BRAIN NATRIURETIC PEPTIDE  URINALYSIS, ROUTINE W REFLEX MICROSCOPIC  PROTIME-INR  MAGNESIUM  LEVETIRACETAM LEVEL  CBG MONITORING, ED  I-STAT CG4 LACTIC ACID, ED  I-STAT TROPONIN, ED  I-STAT CG4 LACTIC ACID, ED    EKG  EKG Interpretation  Date/Time:  Friday November 19 2017 14:59:17 EST Ventricular Rate:  134 PR Interval:    QRS Duration: 174 QT Interval:  413 QTC Calculation: 610 R Axis:   0 Text Interpretation:  Atrial fibrillation Paired ventricular premature complexes Nonspecific intraventricular conduction delay Borderline repolarization abnormality too  much tremor artifact. for rythm interp. no obvious ishemic changes. no ld comp. Confirmed by Arby Barrette [phone]) on 11/19/2017 3:14:24 PM       Radiology Ct Head Wo Contrast  Result Date: 11/19/2017 CLINICAL DATA:  Altered mental status worsening recently. EXAM: CT HEAD WITHOUT  CONTRAST TECHNIQUE: Contiguous axial images were obtained from the base of the skull through the vertex without intravenous contrast. COMPARISON:  None. FINDINGS: Brain:  No abnormality is seen affecting the brainstem or cerebellum. Right cerebral hemisphere appears normal. Left cerebral hemisphere shows old appearing infarctions in the MCA territory affecting the frontal and parietal regions, with atrophy, encephalomalacia and gliosis. No acute component identified. No sign of hemorrhage or mass effect. No hydrocephalus or extra-axial collection. Vascular: There is atherosclerotic calcification of the major vessels at the base of the brain. Skull: Negative Sinuses/Orbits: Clear/normal Other: None IMPRESSION: No acute finding by CT. Old infarctions in the left MCA territory as described above. Electronically Signed   By: Paulina Fusi M.D.   On: 11/19/2017 17:17   Dg Chest Port 1 View  Result Date: 11/19/2017 CLINICAL DATA:  Altered mental status. EXAM: PORTABLE CHEST 1 VIEW COMPARISON:  None. FINDINGS: Study is hypoinspiratory. There is marked cardiomegaly, perhaps slightly accentuated by the low lung volumes. Lungs appear clear. No pleural effusion or pneumothorax seen. Left chest wall pacemaker leads appear appropriately positioned. No acute or suspicious osseous finding. Degenerative changes noted at the right shoulder. IMPRESSION: Low lung volumes. Prominence of the cardiomediastinal silhouette, perhaps slightly accentuated by the low lung volumes. Lungs appear clear. Electronically Signed   By: Bary Richard M.D.   On: 11/19/2017 15:59    Procedures Procedures (including critical care time)  Medications Ordered in ED Medications  levETIRAcetam (KEPPRA) 500 mg in sodium chloride 0.9 % 100 mL IVPB (not administered)  0.9 %  sodium chloride infusion (not administered)     Initial Impression / Assessment and Plan / ED Course  I have reviewed the triage vital signs and the nursing notes.  Pertinent  labs & imaging results that were available during my care of the patient were reviewed by me and considered in my medical decision making (see chart for details).    Consult: Neurology Dr. Jerrell Belfast.  Recommends admission for EEG in the morning and MRI if possible otherwise repeat CT at 24 hours.  20: 35 patient is improved compared to her first assessment.  She now gazes at me with more alert appearance.  She responds yes to certain questions.  Although it took some effort, she did tell me her name as requested.  She will smile in response to pleasant interaction.  She followed commands to grip both my hands.  She was however significantly delayed and releasing them.  The patient still is not quick in her responses but compared to first examination appears significantly better.  I have some increased suspicion that possibly patient exhibited focal seizures earlier.  As per Dr. Roxine Caddy plan, patient will have an extra dose of IV Keppra.  At this time do not suspect infectious etiology.  Diagnostic workup for infection is without UTI, leukocytosis or fever.  Patient is on a complex medication regimen.  She also has a history of prior stroke.  Patient had hip fracture but lower extremities, although severely edematous do not show suspicion of cellulitis or asymmetric swelling.  No hypoxia, no tachycardia.  Will plan for admission and further evaluation as per consultation with neurology.  Final Clinical Impressions(s) / ED Diagnoses   Final diagnoses:  Disorientation  Severe comorbid illness    ED Discharge Orders    None       Arby Barrette, MD 11/19/17 2052

## 2017-11-19 NOTE — ED Notes (Signed)
Portable xray at bedside.

## 2017-11-19 NOTE — ED Triage Notes (Addendum)
[facility] for increased A[facility]. Per EMS LSN 1530 on 11/18/17, and per facility staff pt has not been talking or eating. Pt hypotensive with EMS, received 700 cc NS; BP 122/83 on arrival. Pt had recent rt hip replacement on 11/12/17. Pt alert, intermittent incomprehensible speech, not following commands. Resp e/u.

## 2017-11-19 NOTE — ED Notes (Signed)
Phleb at bedside to draw labs. 

## 2017-11-20 ENCOUNTER — Inpatient Hospital Stay (HOSPITAL_COMMUNITY): Payer: Medicare Other

## 2017-11-20 ENCOUNTER — Encounter (HOSPITAL_COMMUNITY): Payer: Self-pay | Admitting: Family Medicine

## 2017-11-20 DIAGNOSIS — R4182 Altered mental status, unspecified: Secondary | ICD-10-CM | POA: Diagnosis not present

## 2017-11-20 DIAGNOSIS — S72001A Fracture of unspecified part of neck of right femur, initial encounter for closed fracture: Secondary | ICD-10-CM | POA: Diagnosis not present

## 2017-11-20 DIAGNOSIS — R410 Disorientation, unspecified: Secondary | ICD-10-CM | POA: Diagnosis not present

## 2017-11-20 DIAGNOSIS — D61818 Other pancytopenia: Secondary | ICD-10-CM | POA: Diagnosis not present

## 2017-11-20 DIAGNOSIS — I130 Hypertensive heart and chronic kidney disease with heart failure and stage 1 through stage 4 chronic kidney disease, or unspecified chronic kidney disease: Secondary | ICD-10-CM | POA: Diagnosis not present

## 2017-11-20 DIAGNOSIS — N179 Acute kidney failure, unspecified: Secondary | ICD-10-CM | POA: Diagnosis not present

## 2017-11-20 LAB — BASIC METABOLIC PANEL
Anion gap: 10 (ref 5–15)
BUN: 34 mg/dL — ABNORMAL HIGH (ref 6–20)
CHLORIDE: 102 mmol/L (ref 101–111)
CO2: 27 mmol/L (ref 22–32)
CREATININE: 2.27 mg/dL — AB (ref 0.44–1.00)
Calcium: 8.2 mg/dL — ABNORMAL LOW (ref 8.9–10.3)
GFR calc Af Amer: 24 mL/min — ABNORMAL LOW (ref >60)
GFR calc non Af Amer: 20 mL/min — ABNORMAL LOW (ref >60)
GLUCOSE: 118 mg/dL — AB (ref 65–99)
Potassium: 4.9 mmol/L (ref 3.5–5.1)
SODIUM: 139 mmol/L (ref 135–145)

## 2017-11-20 LAB — CBC
HEMATOCRIT: 30.4 % — AB (ref 36.0–46.0)
Hemoglobin: 9 g/dL — ABNORMAL LOW (ref 12.0–15.0)
MCH: 30.8 pg (ref 26.0–34.0)
MCHC: 29.6 g/dL — AB (ref 30.0–36.0)
MCV: 104.1 fL — ABNORMAL HIGH (ref 78.0–100.0)
PLATELETS: 170 10*3/uL (ref 150–400)
RBC: 2.92 MIL/uL — ABNORMAL LOW (ref 3.87–5.11)
RDW: 16.9 % — AB (ref 11.5–15.5)
WBC: 8 10*3/uL (ref 4.0–10.5)

## 2017-11-20 LAB — VITAMIN B12: Vitamin B-12: 1438 pg/mL — ABNORMAL HIGH (ref 180–914)

## 2017-11-20 LAB — HEMOGLOBIN A1C
HEMOGLOBIN A1C: 4.9 % (ref 4.8–5.6)
Mean Plasma Glucose: 93.93 mg/dL

## 2017-11-20 LAB — CREATININE, SERUM
Creatinine, Ser: 2.25 mg/dL — ABNORMAL HIGH (ref 0.44–1.00)
GFR calc Af Amer: 24 mL/min — ABNORMAL LOW (ref >60)
GFR calc non Af Amer: 21 mL/min — ABNORMAL LOW (ref >60)

## 2017-11-20 LAB — LIPID PANEL
CHOL/HDL RATIO: 1.8 ratio
Cholesterol: 68 mg/dL (ref 0–200)
HDL: 37 mg/dL — AB (ref >40)
LDL CALC: 23 mg/dL (ref 0–99)
TRIGLYCERIDES: 39 mg/dL (ref <150)
VLDL: 8 mg/dL (ref 0–40)

## 2017-11-20 LAB — GLUCOSE, CAPILLARY: Glucose-Capillary: 92 mg/dL (ref 65–99)

## 2017-11-20 LAB — RAPID URINE DRUG SCREEN, HOSP PERFORMED
AMPHETAMINES: NOT DETECTED
BARBITURATES: NOT DETECTED
BENZODIAZEPINES: NOT DETECTED
Cocaine: NOT DETECTED
Opiates: NOT DETECTED
Tetrahydrocannabinol: NOT DETECTED

## 2017-11-20 LAB — HIV ANTIBODY (ROUTINE TESTING W REFLEX): HIV SCREEN 4TH GENERATION: NONREACTIVE

## 2017-11-20 LAB — TSH: TSH: 1.36 u[IU]/mL (ref 0.350–4.500)

## 2017-11-20 LAB — FOLATE: Folate: 11.5 ng/mL (ref >5.9)

## 2017-11-20 LAB — TROPONIN I
TROPONIN I: 0.09 ng/mL — AB (ref <0.03)
Troponin I: 0.09 ng/mL (ref <0.03)
Troponin I: 0.09 ng/mL (ref <0.03)

## 2017-11-20 LAB — ETHANOL

## 2017-11-20 LAB — RPR: RPR Ser Ql: NONREACTIVE

## 2017-11-20 LAB — CREATININE, URINE, RANDOM: Creatinine, Urine: 51.23 mg/dL

## 2017-11-20 MED ORDER — ZINC OXIDE 12.8 % EX OINT
1.0000 "application " | TOPICAL_OINTMENT | Freq: Two times a day (BID) | CUTANEOUS | Status: DC
  Administered 2017-11-20 – 2017-11-22 (×5): 1 via TOPICAL
  Filled 2017-11-20 (×2): qty 56.7

## 2017-11-20 MED ORDER — PRO-STAT SUGAR FREE PO LIQD
30.0000 mL | Freq: Two times a day (BID) | ORAL | Status: DC
  Administered 2017-11-20 – 2017-11-22 (×6): 30 mL via ORAL
  Filled 2017-11-20 (×6): qty 30

## 2017-11-20 MED ORDER — HYDROCERIN EX CREA
1.0000 | TOPICAL_CREAM | Freq: Every day | CUTANEOUS | Status: DC
Start: 2017-11-20 — End: 2017-11-23
  Administered 2017-11-20 – 2017-11-22 (×3): 1 via TOPICAL
  Filled 2017-11-20: qty 113

## 2017-11-20 MED ORDER — HYDRALAZINE HCL 25 MG PO TABS
12.5000 mg | ORAL_TABLET | Freq: Two times a day (BID) | ORAL | Status: DC
  Administered 2017-11-20 – 2017-11-22 (×5): 12.5 mg via ORAL
  Filled 2017-11-20 (×6): qty 1

## 2017-11-20 MED ORDER — SODIUM CHLORIDE 0.9 % IV SOLN
250.0000 mg | Freq: Two times a day (BID) | INTRAVENOUS | Status: DC
  Filled 2017-11-20 (×2): qty 2.5

## 2017-11-20 MED ORDER — STROKE: EARLY STAGES OF RECOVERY BOOK
Freq: Once | Status: DC
  Administered 2017-11-20: 02:00:00
  Filled 2017-11-20: qty 1

## 2017-11-20 MED ORDER — SERTRALINE HCL 25 MG PO TABS
25.0000 mg | ORAL_TABLET | Freq: Every day | ORAL | Status: DC
  Administered 2017-11-20 – 2017-11-22 (×3): 25 mg via ORAL
  Filled 2017-11-20 (×3): qty 1

## 2017-11-20 MED ORDER — LEVETIRACETAM 250 MG PO TABS: 250.0000 mg | ORAL_TABLET | Freq: Two times a day (BID) | ORAL | Status: DC

## 2017-11-20 MED ORDER — LABETALOL HCL 5 MG/ML IV SOLN
5.0000 mg | INTRAVENOUS | Status: DC | PRN
  Administered 2017-11-20: 5 mg via INTRAVENOUS
  Filled 2017-11-20: qty 4

## 2017-11-20 MED ORDER — FERROUS SULFATE 325 (65 FE) MG PO TABS
325.0000 mg | ORAL_TABLET | Freq: Every day | ORAL | Status: DC
  Administered 2017-11-20 – 2017-11-22 (×3): 325 mg via ORAL
  Filled 2017-11-20 (×3): qty 1

## 2017-11-20 MED ORDER — OXYCODONE-ACETAMINOPHEN 5-325 MG PO TABS: 1.0000 | ORAL_TABLET | Freq: Two times a day (BID) | ORAL | Status: DC | PRN

## 2017-11-20 MED ORDER — BISACODYL 5 MG PO TBEC: 10.0000 mg | DELAYED_RELEASE_TABLET | Freq: Every day | ORAL | Status: DC | PRN

## 2017-11-20 MED ORDER — ATORVASTATIN CALCIUM 20 MG PO TABS
20.0000 mg | ORAL_TABLET | Freq: Every day | ORAL | Status: DC
  Administered 2017-11-20 – 2017-11-22 (×3): 20 mg via ORAL
  Filled 2017-11-20 (×3): qty 1

## 2017-11-20 MED ORDER — AMIODARONE HCL 200 MG PO TABS
200.0000 mg | ORAL_TABLET | Freq: Two times a day (BID) | ORAL | Status: DC
  Administered 2017-11-20 – 2017-11-22 (×6): 200 mg via ORAL
  Filled 2017-11-20 (×6): qty 1

## 2017-11-20 MED ORDER — POLYVINYL ALCOHOL 1.4 % OP SOLN
1.0000 [drp] | Freq: Every day | OPHTHALMIC | Status: DC
  Administered 2017-11-20 – 2017-11-22 (×3): 1 [drp] via OPHTHALMIC
  Filled 2017-11-20: qty 15

## 2017-11-20 MED ORDER — TORSEMIDE 20 MG PO TABS
40.0000 mg | ORAL_TABLET | Freq: Every day | ORAL | Status: DC
  Administered 2017-11-20 – 2017-11-22 (×3): 40 mg via ORAL
  Filled 2017-11-20 (×3): qty 2

## 2017-11-20 MED ORDER — ISOSORBIDE DINITRATE 20 MG PO TABS
20.0000 mg | ORAL_TABLET | Freq: Three times a day (TID) | ORAL | Status: DC
  Administered 2017-11-20 – 2017-11-22 (×8): 20 mg via ORAL
  Filled 2017-11-20 (×9): qty 1

## 2017-11-20 MED ORDER — PREGABALIN 25 MG PO CAPS
25.0000 mg | ORAL_CAPSULE | Freq: Every day | ORAL | Status: DC
  Administered 2017-11-21 – 2017-11-22 (×2): 25 mg via ORAL
  Filled 2017-11-20 (×2): qty 1

## 2017-11-20 MED ORDER — CALCIUM CARBONATE-VITAMIN D 500-200 MG-UNIT PO TABS
1.0000 | ORAL_TABLET | Freq: Two times a day (BID) | ORAL | Status: DC
  Administered 2017-11-20 – 2017-11-22 (×6): 1 via ORAL
  Filled 2017-11-20 (×6): qty 1

## 2017-11-20 MED ORDER — CARVEDILOL 12.5 MG PO TABS
12.5000 mg | ORAL_TABLET | Freq: Two times a day (BID) | ORAL | Status: DC
  Administered 2017-11-20 – 2017-11-22 (×6): 12.5 mg via ORAL
  Filled 2017-11-20 (×6): qty 1

## 2017-11-20 MED ORDER — LEVETIRACETAM 250 MG PO TABS
250.0000 mg | ORAL_TABLET | Freq: Two times a day (BID) | ORAL | Status: DC
  Administered 2017-11-20 – 2017-11-22 (×6): 250 mg via ORAL
  Filled 2017-11-20 (×6): qty 1

## 2017-11-20 MED ORDER — ENOXAPARIN SODIUM 40 MG/0.4ML ~~LOC~~ SOLN
40.0000 mg | SUBCUTANEOUS | Status: DC
  Administered 2017-11-20 – 2017-11-22 (×3): 40 mg via SUBCUTANEOUS
  Filled 2017-11-20 (×3): qty 0.4

## 2017-11-20 MED ORDER — AMLODIPINE BESYLATE 5 MG PO TABS
5.0000 mg | ORAL_TABLET | Freq: Every day | ORAL | Status: DC
  Administered 2017-11-20 – 2017-11-22 (×3): 5 mg via ORAL
  Filled 2017-11-20 (×3): qty 1

## 2017-11-20 MED ORDER — DOCUSATE SODIUM 100 MG PO CAPS
100.0000 mg | ORAL_CAPSULE | Freq: Two times a day (BID) | ORAL | Status: DC
  Filled 2017-11-20: qty 1

## 2017-11-20 MED ORDER — LORAZEPAM 0.5 MG PO TABS: 0.5000 mg | ORAL_TABLET | Freq: Once | ORAL | Status: DC | PRN

## 2017-11-20 MED ORDER — LISINOPRIL 5 MG PO TABS
2.5000 mg | ORAL_TABLET | Freq: Every day | ORAL | Status: DC
  Administered 2017-11-20: 2.5 mg via ORAL
  Filled 2017-11-20: qty 1

## 2017-11-20 MED ORDER — LORAZEPAM 0.5 MG PO TABS: 0.5000 mg | ORAL_TABLET | Freq: Once | ORAL | Status: DC

## 2017-11-20 MED ORDER — NITROGLYCERIN 0.4 MG SL SUBL: 0.4000 mg | SUBLINGUAL_TABLET | SUBLINGUAL | Status: DC | PRN

## 2017-11-20 MED ORDER — LACOSAMIDE 50 MG PO TABS
100.0000 mg | ORAL_TABLET | Freq: Two times a day (BID) | ORAL | Status: DC
  Administered 2017-11-20 – 2017-11-22 (×6): 100 mg via ORAL
  Filled 2017-11-20 (×6): qty 2

## 2017-11-20 NOTE — Procedures (Signed)
History: 72 year old female with encephalopathy  Sedation: None  Technique: This is a 21 channel routine scalp EEG performed at the bedside with bipolar and monopolar montages arranged in accordance to the international 10/20 system of electrode placement. One channel was dedicated to EKG recording.    Background: The background is markedly obscured by muscle activity but much of the recording.  During periods of relative quiescence, there are no epileptiform or periodic discharges.  There is a posterior dominant rhythm of 7.5 Hz which is seen.   The background is predominated, however, by generalized irregular delta and theta activities.  Sleep is not observed.   Photic stimulation: Physiologic driving is not performed  EEG Abnormalities: 1) generalized irregular select  Clinical Interpretation: This EEG is consistent with a  generalized non-specific cerebral dysfunction(encephalopathy). There was no seizure or seizure predisposition recorded on this study. Please note that a normal EEG does not preclude the possibility of epilepsy.   Ritta SlotMcNeill Nguyen Todorov, MD [facility] [phone]  If 7pm- 7am, please page neurology on call as listed in AMION.

## 2017-11-20 NOTE — Progress Notes (Signed)
Family Medicine Teaching Service Daily Progress Note Intern Pager: [phone]  Patient name: Alexandra Potts Medical record number: [id number] Date of birth: 02/16/1946 Age: 72 y.o. Gender: female  Primary Care Provider: Patient, No Pcp Per Consultants: Neuro Code Status: Full   Pt Overview and Major Events to Date:  2/8: Admit to FPTS for AMS  Assessment and Plan: Alexandra Kaufmannlaine Rzasa is a 72 y.o. female presenting with altered mental status from her nursing home.  PMH is significant for right hip fracture status post s/p right hip arthroplasty 11/02/17, CAD, angina pectoralis, CKD 4, Alzheimer's disease, CHF w/ biventricular ICD, hypertension, pancytopenia, atrial fibrillation, pancytopenia, epilepsy, CVA of MCA with hemiplegia  Altered mental status: Unclear etiology still. Apparently more confused from baseline on morning of 2/8 at [facility] and was brought in by EMS to the ED for evaluation. According to SNF nurse, baseline mental status since hip surgery is only oriented to self, moves UE bilaterally but not LE. No signs of acute stroke on admission CT. Has hx of a-fib, not anticoagulated since her hip surgery on 11/02/17. AMS likely multifactorial but differentials remain broad; pain from hip fracture vs stroke vs seizure vs AKI (Cr 2.25) vs ACS (troponin 0.09). No observed seizures this far while inpatient. B12 actually elevated, normal folate. EtOH negative. UDS unremarkable. Normal TSH to 1.3.  -Vitals per floor protocol -Neurology consulted, appreciate recommendations -Telemetry -Echocardiogram -RPR, HIV -Blood cultures, urine cultures pending -Keppra 250 mg every 12 hours -Vimpat 100 mg twice daily  -EEG this AM -MRI less likely as ICD ([employer]) does not seem compatible based on research -Daily weights -Strict I's and O's -Continuous pulse ox -Neurochecks q.4 hours -Seizure precautions -Vital signs per floor -PT/OT/speech -Trend troponins -Daily BMP, CBC -Hold sedating meds  while altered  Elevated Troponin: Trop 0.09>0.09 . EKG showing paced rhythm. No ST elevation or depression. Patient not endorsing chest pain however she only answers yes and no questions. - Continue to trend troponin - Continue cardiac monitoring  Right hip fracture of right femoral neck: Status post s/p right hip arthroplasty 11/02/17, Discharge from hospital with non weight bearing and orthotic.Patient was to complete 7 day course of doxycycline for drainage from incision site.  Not on current med list.  Unsure if patient finished.  Patient was anticoagulated with Xarelto for atrial fibrillation, however this was discontinued upon discharge of her last hospitalization until she was cleared by outpatient orthopedics. On percocet at home 5-3 25 -Continue leg brace - Will need outpatient follow up next week with ortho -Continue percocet 5 mg BID PRN  AKI on CKD: Likely due to worsening cardiorenal syndrome due to CHF, may be worse due to stress of current illness question. Additionally could be prerenal etiology from dehydration (weight is down 9 lbs from January). Cr 2.27 on admission, was 1.31 on 1/31. Cr 2.25 this AM.  -Daily BMP -Resume home Torsemide  -Holding IV fluids for now, will need to add some if she is not able to orally hydrate herself - Urine urea and creatine to calculate FeUrea (no FeNa while on diuretics) - No documented UO but does seem to have UO on exam  CHF, intermediate: Echocardiogram on 10/19/17shows EF 51%.  BNP on admission does not support active exacerbation.  At home, patient is on on torsemide 40 mg, carvedilol 12.5 mg twice daily, weight on discharge was 372 pounds. Weight on admission is 363.  Patient is down 9 pounds since hospital discharge on 1/31. -Restart torsemide and carvedilol  -  strict I's and O's, daily weights - Repeating echo as above  Hypertension, chronic: Controlled  - Resume home amlodipine 5 mg daily, hydralazine 12.5 mg twice daily - Hold  home lisinopril 2.5 mg with AKI  History of dysphagia: Previously eval by speech pathology and advanced to chopped diet.  Given patient's AMS.   -Will consult speech further recommendations. - Dysphagia 1 diet for now if she passes stroke swallow screen  Epilepsy, chronic:Takes Keppra 250 mg twice daily, Vimpat 100 mg twice daily. S/p Keppra load per neurology on 2/8. -IV Keppra as above until passes stroke swallow screen and can change to PO - Continue Vimpat 100 mg BID - Neurology consulted as above  A fib, chronic, s/p pacer/AICD:  -Rate control amiodarone 200 mg.   -Holding Xarelto 15 mg daily still  CAD-chronic:,  -Consider increasing atorvastatin to 40 mg -Continue with statin and Imdur   Hx of CVAwith residual hemiplegia. -Has aspirin allergy.  We will not initiate.  Will hold Xarelto as above. -Home atorvastatin 20 mg,consider increasing  Chronic anemia with pancytopenia: Hemoglobin on admission was 8.6.  Hemoglobin 7.8 2/6.  Patient had ALBA status post surgery.  This was likely contributing to current low.  Patient has macrocytosis on admission.  Also has history of iron deficiency anemia.  She is on 325 mg of iron daily at home.  Consider anemia panel this hospitalization.  Obtaining B12 and folate.  -Trend hemoglobin and platelets  - Continue home iron - Transfusion threshold hgb <8 due to CAD   History of pressure ulcerinner thigh RLE, POA, stage 1-2. Does not observe pressure ulcer on lower extremities.  Lower extremities are consistent with significant chronic venous stasis -Wound care consult  FEN/GI: N.p.o. Prophylaxis: Lovenox for DVT prophylaxis  Disposition: continue to monitor on tele  Subjective:  Patient again only saying yes and no to questions.   Called patient's SNF nurse, Archie Patten, who provided some history; Since hip fracture in January, she hasn't been normal self, takes longer to respond, mostly yes and no answers. Doesn't like head of bed  raised. Yesterday patient wasn't hollering when she was being moved which was unusual for her. RN notes that she wasn't swallowing pills like she normally does, she was just holding them in her mouth. She also wasn't responding to her name. She did not react when shining pen light in her eyes. Would not follow any commands. She cannot move her lower extremities at baseline. Normally she has lots of swelling in her legs at baseline. Needs spoon feeding.   Objective: Temp:  [97.5 F (36.4 C)-98.6 F (37 C)] 97.5 F (36.4 C) (02/09 0424) Pulse Rate:  [62-68] 65 (02/09 0424) Resp:  [9-18] 16 (02/09 0424) BP: (98-143)/(58-128) 103/58 (02/09 0424) SpO2:  [99 %-100 %] 99 % (02/09 0424) Weight:  [363 lb 15.7 oz (165.1 kg)-365 lb 15.4 oz (166 kg)] 365 lb 15.4 oz (166 kg) (02/09 0424) Physical Exam: General: Morbidly obese, NAD, alert when eating or when someone is speaking to her Cardiovascular: distant heart sounds, RRR Respiratory: normal work of breathing, difficult to appreciate any lung sounds secondary to body habitus Abdomen: obese abdomen, soft, non distended, non tender  Extremities: 3+ pitting edema from knees down, 1+ pitting edema in thighs Neuro: alert to self but does not respond to other orientation questions, moves b/l upper extremities spontaneously, no movement of b/l LE.   Laboratory: Recent Labs  Lab 11/19/17 1455 11/20/17 0135  WBC 7.5 8.0  HGB 8.6* 9.0*  HCT 28.1* 30.4*  PLT 183 170   Recent Labs  Lab 11/19/17 1455 11/20/17 0135 11/20/17 0139  NA 142 139  --   K 4.9 4.9  --   CL 105 102  --   CO2 27 27  --   BUN 33* 34*  --   CREATININE 2.21* 2.27* 2.25*  CALCIUM 8.2* 8.2*  --   PROT 5.8*  --   --   BILITOT 0.9  --   --   ALKPHOS 115  --   --   ALT 15  --   --   AST 27  --   --   GLUCOSE 98 118*  --      Imaging/Diagnostic Tests: Ct Head Wo Contrast  Result Date: 11/19/2017 CLINICAL DATA:  Altered mental status worsening recently. EXAM: CT HEAD  WITHOUT CONTRAST TECHNIQUE: Contiguous axial images were obtained from the base of the skull through the vertex without intravenous contrast. COMPARISON:  None. FINDINGS: Brain: No abnormality is seen affecting the brainstem or cerebellum. Right cerebral hemisphere appears normal. Left cerebral hemisphere shows old appearing infarctions in the MCA territory affecting the frontal and parietal regions, with atrophy, encephalomalacia and gliosis. No acute component identified. No sign of hemorrhage or mass effect. No hydrocephalus or extra-axial collection. Vascular: There is atherosclerotic calcification of the major vessels at the base of the brain. Skull: Negative Sinuses/Orbits: Clear/normal Other: None IMPRESSION: No acute finding by CT. Old infarctions in the left MCA territory as described above. Electronically Signed   By: Paulina Fusi M.D.   On: 11/19/2017 17:17   Dg Chest Port 1 View  Result Date: 11/19/2017 CLINICAL DATA:  Altered mental status. EXAM: PORTABLE CHEST 1 VIEW COMPARISON:  None. FINDINGS: Study is hypoinspiratory. There is marked cardiomegaly, perhaps slightly accentuated by the low lung volumes. Lungs appear clear. No pleural effusion or pneumothorax seen. Left chest wall pacemaker leads appear appropriately positioned. No acute or suspicious osseous finding. Degenerative changes noted at the right shoulder. IMPRESSION: Low lung volumes. Prominence of the cardiomediastinal silhouette, perhaps slightly accentuated by the low lung volumes. Lungs appear clear. Electronically Signed   By: Bary Richard M.D.   On: 11/19/2017 15:59    Beaulah Dinning, MD 11/20/2017, 8:47 AM PGY-3, [facility] FPTS Intern pager: [phone], text pages welcome

## 2017-11-20 NOTE — Progress Notes (Signed)
PT Cancellation Note  Patient Details Name: Alexandra Potts MRN: [id number] DOB: 10/12/1945   Cancelled Treatment:    Reason Eval/Treat Not Completed: Patient not medically ready, has active bedrest orders. Will reattempt once activity orders increased.    Fidencio Duddy L Marvena Tally 11/20/2017, 12:40 PM

## 2017-11-20 NOTE — Evaluation (Signed)
Speech Language Pathology Evaluation Patient Details Name: Josem Kaufmannlaine Olliff MRN: [id number] DOB: 09/22/1946 Today's Date: 11/20/2017 Time: 0930-1000 SLP Time Calculation (min) (ACUTE ONLY): 30 min  Problem List:  Patient Active Problem List   Diagnosis Date Noted  . Altered mental status 11/19/2017   Past Medical History:  Past Medical History:  Diagnosis Date  . Alzheimer's disease   . Anemia of chronic disease   . Atrial fibrillation (HCC)   . CHF (congestive heart failure) (HCC)   . CKD (chronic kidney disease), stage IV (HCC)   . Dementia   . Depression   . Epilepsy (HCC)   . GERD (gastroesophageal reflux disease)   . Hip fracture (HCC)   . Hyperlipidemia   . Hypertension   . Pacemaker   . Stroke [facility](HCC)    Past Surgical History: History reviewed. No pertinent surgical history. HPI:  Patient is a 72 y.o. female presenting to the hospital with AMS from her SNF. PMH: right hip fracture s/p right hip arthroplasty, CAD, angina pectoralis, Alzheimer's dementia, CHF, HTN, atrial fibrillation, epilepsy, CVA of MCA with hemiplegia.   Assessment / Plan / Recommendation Clinical Impression  Patient presents with severe cognitive-linguistic deficits, but despite not knowing her prior level of function, this clinician feels she is likely close to or at her baseline for speech, language and cognitive function. Patient is a long term SNF resident who had been brought to hospital with SNF staff concerns for AMS. Currently, patient keeps eyes closed, though intermittently will open them, she will follow some commands, but inconsistent as her attention and alertness fluctuates. Patient would vocalize and grimace to pain, occasionally said "yes" when someone said her name, but did not exhibit any attempts of functional communication (verbal or otherwise). As it does not appear as though patient is very far from her baseline, SLP not recommending speech-language services at this time for speech,  language or cognition.     SLP Assessment  SLP Recommendation/Assessment: Patient does not need any further Speech Lanaguage Pathology Services SLP Visit Diagnosis: Cognitive communication deficit (R41.841)    Follow Up Recommendations    N/A, return to SNF   Frequency and Duration     N/A      SLP Evaluation Cognition  Overall Cognitive Status: No family/caregiver present to determine baseline cognitive functioning Arousal/Alertness: Lethargic Orientation Level: Disoriented X4 Attention: Focused Focused Attention: Impaired Focused Attention Impairment: Functional basic;Verbal basic Awareness: Impaired Awareness Impairment: Intellectual impairment Problem Solving: Impaired Problem Solving Impairment: Functional basic       Comprehension  Auditory Comprehension Overall Auditory Comprehension: Impaired Yes/No Questions: Impaired Basic Biographical Questions: 0-25% accurate Commands: Impaired One Step Basic Commands: 25-49% accurate Interfering Components: Attention EffectiveTechniques: Other (Comment)(tactile cues for cup sips, spoon bites)    Expression Verbal Expression Overall Verbal Expression: Impaired Initiation: Impaired Level of Generative/Spontaneous Verbalization: Word Other Verbal Expression Comments: Patient would inconsistently respond "yes" when asked a question, but this appeared to be more of a reaction and no true intention when saying "yes" She perseverated on saying "squeeze" after the MD told her to squeeze her hand. Written Expression Written Expression: Not tested   Oral / Motor  Oral Motor/Sensory Function Overall Oral Motor/Sensory Function: Other (comment)(Difficult to assess secondary to patient's poor ability to follow commands. Edentulous with tongue out at rest.) Motor Speech Overall Motor Speech: Other (comment)(unable to assess. )   GO                    Iviana Blasingame T.  [place], [place], CCC-SLP 11/20/17 12:37 PM

## 2017-11-20 NOTE — Progress Notes (Signed)
OT Cancellation Note  Patient Details Name: Alexandra Potts MRN: [id number] DOB: 10/23/1945   Cancelled Treatment:    Reason Eval/Treat Not Completed: Other (comment) (Pt with active bed rest order.). Plan to reattempt once activity orders increased.    Raynald KempKathryn Adalina Dopson OTR/L Pager: [phone]   11/20/2017, 12:18 PM

## 2017-11-20 NOTE — Progress Notes (Signed)
Paged team regarding critical lab-troponin 0.09

## 2017-11-20 NOTE — Progress Notes (Signed)
Patient admitted to [phone] from ED, eyes open but unable to verbally respond. Responds to pain of turning by yelling out. Strong grip with bilateral hands. Patient admitted for altered mental status and hypotension. History of right hip replacement staples intact green drainage noted, cleaned site with chg swab and applied dry dressing/tape. Bath given and Peri care performed. Sacrum border applied skin intact, left thigh foam dressing applied to red areas MASD. Antifungal powder applied to skin folds for MASD. Patient unable to answer questions or follow commands at this time no family at bedside. No evidence of learning at this time.

## 2017-11-20 NOTE — Evaluation (Signed)
Clinical/Bedside Swallow Evaluation Patient Details  Name: Alexandra Potts MRN: [id number] Date of Birth: 08/17/1946  Today's Date: 11/20/2017 Time: SLP Start Time (ACUTE ONLY): 1000 SLP Stop Time (ACUTE ONLY): 1030 SLP Time Calculation (min) (ACUTE ONLY): 30 min  Past Medical History:  Past Medical History:  Diagnosis Date  . Alzheimer's disease   . Anemia of chronic disease   . Atrial fibrillation (HCC)   . CHF (congestive heart failure) (HCC)   . CKD (chronic kidney disease), stage IV (HCC)   . Dementia   . Depression   . Epilepsy (HCC)   . GERD (gastroesophageal reflux disease)   . Hip fracture (HCC)   . Hyperlipidemia   . Hypertension   . Pacemaker   . Stroke [facility](HCC)    Past Surgical History: History reviewed. No pertinent surgical history. HPI:  Patient is a 72 y.o. female presenting to the hospital with AMS from her SNF. PMH: right hip fracture s/p right hip arthroplasty, CAD, angina pectoralis, Alzheimer's dementia, CHF, HTN, atrial fibrillation, epilepsy, CVA of MCA with hemiplegia.   Assessment / Plan / Recommendation Clinical Impression  Patient presents with a mild-mod oropharyngeal dysphagia with impact from flucuating alertness and attention. Patient did not exhibit any overt s/s of aspiration or penetration, but did exhbiit delayed initiation of swallow with all boluses, but most pronounced with thin liquids. Patient made no attempts to feed self, and only reacted appropriately to food or liquid with tactile (touching spoon to lips, cup to lips, etc) cues. Patient did not have any oral residuals post-swallows.  SLP Visit Diagnosis: Dysphagia, oropharyngeal phase (R13.12)    Aspiration Risk  Moderate aspiration risk    Diet Recommendation Dysphagia 1 (Puree);Nectar-thick liquid   Liquid Administration via: Cup Medication Administration: Crushed with puree Supervision: Full supervision/cueing for compensatory strategies;Staff to assist with self  feeding Compensations: Minimize environmental distractions;Slow rate;Small sips/bites Postural Changes: Seated upright at 90 degrees    Other  Recommendations Oral Care Recommendations: Oral care BID   Follow up Recommendations Skilled Nursing facility      Frequency and Duration min 2x/week  1 week       Prognosis Prognosis for Safe Diet Advancement: Fair Barriers to Reach Goals: Cognitive deficits;Severity of deficits      Swallow Study   General HPI: Patient is a 72 y.o. female presenting to the hospital with AMS from her SNF. PMH: right hip fracture s/p right hip arthroplasty, CAD, angina pectoralis, Alzheimer's dementia, CHF, HTN, atrial fibrillation, epilepsy, CVA of MCA with hemiplegia.    Oral/Motor/Sensory Function Overall Oral Motor/Sensory Function: Other (comment)(difficult to assess due to patient not consistently following commands)   Ice Chips Ice chips: Not tested   Thin Liquid Thin Liquid: Impaired Presentation: Cup;Spoon Pharyngeal  Phase Impairments: Suspected delayed Swallow Other Comments: no overt s/s of aspiration but patient with delayed swallow initiation.    Nectar Thick Nectar Thick Liquid: Impaired Oral Phase Impairments: Reduced labial seal Pharyngeal Phase Impairments: Suspected delayed Swallow   Honey Thick Honey Thick Liquid: Not tested   Puree Puree: Impaired Presentation: Spoon Pharyngeal Phase Impairments: Suspected delayed Swallow   Solid   GO   Solid: Not tested       Angela NevinJohn T. Viera Okonski, MA, CCC-SLP 11/20/17 12:44 PM

## 2017-11-20 NOTE — Progress Notes (Signed)
Spoke with Dr. Garret ReddishBrazemore, cardiology fellow, regarding patient's elevated troponin of 0.09.  From chart review and our discussion, he does not think that hyperperfusion from worsening CHF is the likely cause of patient's altered mental status.Recommend trending troponins and getting repeat EKG.  If quality of EKG is still poor, consider re-placing the leads.  If troponin continues to climb, reach back out to cardiology for consult.

## 2017-11-20 NOTE — Progress Notes (Signed)
Subjective: Patient has improved from yesterday  Exam: Vitals:   11/20/17 0855 11/20/17 1353  BP: (!) 118/55   Pulse: 72 86  Resp: 14 (!) 22  Temp: 98.2 F (36.8 C) 97.7 F (36.5 C)  SpO2: 100% 92%   Gen: In bed, NAD Resp: non-labored breathing, no acute distress Abd: soft, nt  Neuro: MS: Awakens to mild stimuli, she tells me her name, is able to count fingers, she remains densely encephalopathic, but by description does appear to be improving.  Apparently at baseline she is oriented to person only CN: Mild right facial weakness,  Motor: she moves bilaterally arms, and  Withdraws left leg minimally, no movement right leg.   Sensory: She responds to noxious stimulation in all 4 extremities   Pertinent Labs: BMP-AK I   EEG: I have reviewed her EEG and there is no evidence of ongoing seizure activity, formal read pending  Impression: 72 year old female with encephalopathy in the setting of AKI with multiple sedating medications including Keppra, oxycodone, Lyrica.  I suspect multifactorial delirium, no clear evidence of seizure activity.  Stroke is also a possibility, may need repeat CT tomorrow.  Recommendations: 1) continue Keppra 250 twice daily 2) continue Vimpat 100 Deep twice daily 3) treatment of AK I  4) neurology will continue to follow  Ritta SlotMcNeill Temesgen Weightman, MD [facility] [phone]  If 7pm- 7am, please page neurology on call as listed in AMION.

## 2017-11-20 NOTE — Progress Notes (Addendum)
Initial Nutrition Assessment  DOCUMENTATION CODES:  Morbid obesity  INTERVENTION:  Highly suspect patient had acute decrease in ability to eat/drink during recent ICU admission at [facility] (potentially due to intubation?) and has been unable to meet fluid needs at SNF, becoming dehydrated.   PEG had been considered during [facility] admission late January due to dysphagia. Diet advanced, however Unclear if patient is able to meet needs (specifically fluids) w/ her PO intake at this time. Will monitor intake and follow.   NUTRITION DIAGNOSIS:  Inadequate oral intake(Specifically fluids) related to recent acute illness/ hip fx and dysphagia as evidenced by dehydration (dehydrated Per MD note)   GOAL:  Patient will meet greater than or equal to 90% of their needs  MONITOR:  PO intake, Supplement acceptance, Diet advancement, Labs, Weight trends, I & O's  REASON FOR ASSESSMENT:  Consult Assessment of nutrition requirement/status  ASSESSMENT:  72 y/o female PMHx R hip fx s/p arthroplasty 11/02/17, CAD, CKD4, CHF w/ ICD, HTN, Afib, epilepsy, CVA of MCA w/ hemiplegia, dementia. SNF resident. Presented due to AMS. RD consulted for nutritional assessment.   On RD arrival, patient is attempting to grab her breakfast tray. She repeats phrases, but does not appear to understand questions and is unable to reply. Unsure if she is able to feed herself. RD took spoonful of puree and patient opened mouth and readily consumed it. SLP arrived not soon after. Noted prostat on tray as well.   From Chart/Care everywhere, the patient has had quite significant wt gain long term if documented weights are accurate (see below). She is also up in weight short term. Per Care everywhere, Early during her admission at [facility] on 1/21 she was 358.5 lbs, she was discharged at 372 lbs  Per review of that encounter, patient was planned to get a PEG tube due to prolonged NPO status. Apparently family could not be reached and a 2  Physician consent was considered. However, It does not appear a PEG was ever placed. Had NGT. Diet was advanced to D1 1/27 and chopped on 1/29  Unclear if patient is able to meet needs w/ her PO intake at this time. Will monitor intake and follow.   Labs: Creat: 2.25, H/H:9.0/30.4, A1C 4.9, Phos: 5.2,  Meds: Calcium +D, Iron, Prostat, Demadex  Recent Labs  Lab 11/19/17 1455 11/19/17 1549 11/20/17 0135 11/20/17 0139  NA 142  --  139  --   K 4.9  --  4.9  --   CL 105  --  102  --   CO2 27  --  27  --   BUN 33*  --  34*  --   CREATININE 2.21*  --  2.27* 2.25*  CALCIUM 8.2*  --  8.2*  --   MG  --  2.1  --   --   PHOS  --  5.2*  --   --   GLUCOSE 98  --  118*  --     NUTRITION - FOCUSED PHYSICAL EXAM: No discernible areas of wasting. Morbidly obese.   Diet Order:  Fall precautions Seizure precautions DIET - DYS 1 Room service appropriate? Yes; Fluid consistency: Nectar Thick  EDUCATION NEEDS:  No education needs have been identified at this time  Skin: MSAD to breast, perineum, thigh, sacrum. Weeping to Legs  Last BM:  2/9  Height:  Ht Readings from Last 1 Encounters:  11/20/17 5\' 3"  (1.6 m)   Weight:  Wt Readings from Last 1 Encounters:  11/20/17 Marland Kitchen)  365 lb 15.4 oz (166 kg)   Via Care Everywhere: 11/08/17:372 lbs 10/19/16: 312lbs 08/05/16:310.5 lbs  Ideal Body Weight:  52.27 kg  BMI:  Body mass index is 64.83 kg/m.  Estimated Nutritional Needs:  Kcal:  1400-1600 kcals Protein:  70-80g Pro (20% kcals) Fluid:  >2 liters  Christophe LouisNathan Franks RD, LDN, CNSC Clinical Nutrition Pager: [phone] 11/20/2017 11:16 AM

## 2017-11-20 NOTE — Progress Notes (Signed)
EEG complete - results pending 

## 2017-11-21 DIAGNOSIS — R410 Disorientation, unspecified: Secondary | ICD-10-CM | POA: Diagnosis not present

## 2017-11-21 DIAGNOSIS — N179 Acute kidney failure, unspecified: Secondary | ICD-10-CM | POA: Diagnosis not present

## 2017-11-21 LAB — URINE CULTURE

## 2017-11-21 LAB — CBC
HCT: 26.2 % — ABNORMAL LOW (ref 36.0–46.0)
Hemoglobin: 7.8 g/dL — ABNORMAL LOW (ref 12.0–15.0)
MCH: 30.6 pg (ref 26.0–34.0)
MCHC: 29.8 g/dL — ABNORMAL LOW (ref 30.0–36.0)
MCV: 102.7 fL — AB (ref 78.0–100.0)
PLATELETS: 143 10*3/uL — AB (ref 150–400)
RBC: 2.55 MIL/uL — ABNORMAL LOW (ref 3.87–5.11)
RDW: 16.7 % — AB (ref 11.5–15.5)
WBC: 4.7 10*3/uL (ref 4.0–10.5)

## 2017-11-21 LAB — BASIC METABOLIC PANEL
Anion gap: 12 (ref 5–15)
BUN: 37 mg/dL — ABNORMAL HIGH (ref 6–20)
CO2: 23 mmol/L (ref 22–32)
CREATININE: 2.18 mg/dL — AB (ref 0.44–1.00)
Calcium: 8.2 mg/dL — ABNORMAL LOW (ref 8.9–10.3)
Chloride: 106 mmol/L (ref 101–111)
GFR, EST AFRICAN AMERICAN: 25 mL/min — AB (ref >60)
GFR, EST NON AFRICAN AMERICAN: 21 mL/min — AB (ref >60)
Glucose, Bld: 110 mg/dL — ABNORMAL HIGH (ref 65–99)
Potassium: 4.5 mmol/L (ref 3.5–5.1)
SODIUM: 141 mmol/L (ref 135–145)

## 2017-11-21 LAB — UREA NITROGEN, URINE: Urea Nitrogen, Ur: 279 mg/dL

## 2017-11-21 NOTE — Progress Notes (Signed)
Subjective: Patient continues to improve .   [facility], and she has difficulty with speaking even at baseline though she is able to answer questions.  Exam: Vitals:   11/21/17 0915 11/21/17 1408  BP: 120/66 (!) 107/56  Pulse:  67  Resp:  14  Temp:  98 F (36.7 C)  SpO2:  99%   Gen: In bed, NAD Resp: non-labored breathing, no acute distress Abd: soft, nt  Neuro: MS: Awakens to mild stimuli, she is able to answer questions, states that she is at [facility], has hesitancy of speech, but much better than yesterday.  CN: Visual fields are full, extraocular movements are intact Motor: she moves bilaterally arms, she wiggles her left foot more readily than her right foot, but does wiggle her right foot some as well. Sensory: She endorses sensation in all 4 extremities.   Impression: 72 year old female with encephalopathy in the setting of AKI with multiple sedating medications including Keppra, oxycodone, Lyrica.  I suspect multifactorial delirium, no clear evidence of seizure activity.  She is improving and I would expect her to continue improving gradually as her medical issues improve.    Recommendations: 1) continue Keppra 250 twice daily 2) continue Vimpat 100 bid 3) treatment of AKI  4) at this time, no further neurodiagnostic testing.  I suspect she will gradually improve with time.  Please call with further questions or concerns  Ritta SlotMcNeill Rabon Scholle, MD [facility] [phone]  If 7pm- 7am, please page neurology on call as listed in AMION.

## 2017-11-21 NOTE — Progress Notes (Signed)
Family Medicine Teaching Service Daily Progress Note Intern Pager: [phone]  Patient name: Alexandra Potts Medical record number: [id number] Date of birth: 12/08/1945 Age: 72 y.o. Gender: female  Primary Care Provider: Patient, No Pcp Per Consultants: Neuro Code Status: Full   Pt Overview and Major Events to Date:  2/8: Admit to FPTS for AMS  Assessment and Plan: Alexandra Potts is a 72 y.o. female presenting with altered mental status from her nursing home.  PMH is significant for right hip fracture status post s/p right hip arthroplasty 11/02/17, CAD, angina pectoralis, CKD 4, Alzheimer's disease, CHF w/ biventricular ICD, hypertension, pancytopenia, atrial fibrillation, pancytopenia, epilepsy, CVA of MCA with hemiplegia  Altered mental status: Stable. Unclear etiology still. This am she is able to awaken and is alert but does not say engage well in dialogue. According to SNF nurse, baseline mental status since hip surgery is only oriented to self, moves UE bilaterally but not LE. No signs of acute stroke on admission CT. AMS likely multifactorial but differentials remain broad; pain from hip fracture vs stroke vs seizure vs AKI (Cr 2.25 yesterday). No observed seizures this far while inpatient. B12 actually elevated, normal folate. EtOH negative. UDS unremarkable. Normal TSH to 1.3. EEG was consistent with generalized non-specific cerebral dysfunction (encephalopathy).  -Neurology on board, appreciate recommendations; cont Keppra 250 BID and Vimpat 100 BID -Echocardiogram results pending -Blood cultures NGTD x 2 days; urine cultures multiple species -Strict I's and O's & daily weights: 1L out on 2/9; wt on admission was 363lbs, today 354lbs -Cont Neurochecks q.4 hours and seizure precautions -PT/OT/speech -Cont to hold sedating meds while altered  Elevated Troponin: Acute. Stable. Most likely from demand ischemia. Trop 0.09>0.09>0.09 . EKG showing paced rhythm. No ST elevation or depression.  Patient not endorsing chest pain however she only answers yes and no questions. - Stop cardiac monitoring after 48hrs if no chest pain  Right hip fracture of right femoral neck: Status post s/p right hip arthroplasty 11/02/17, Discharge from hospital with non weight bearing and orthotic.Patient was to complete 7 day course of doxycycline for drainage from incision site.  Not on current med list.  Unsure if patient finished.  Patient was anticoagulated with Xarelto for atrial fibrillation, however this was discontinued upon discharge of her last hospitalization until she was cleared by outpatient orthopedics. On percocet at home 5-3 25 -Continue leg brace -Will need outpatient follow up next week with ortho -Continue percocet 5 mg BID PRN  AKI on CKD: Likely due to worsening cardiorenal syndrome due to CHF, may be worse due to stress of current illness question. Additionally could be prerenal etiology from dehydration (weight is down 9 lbs from January). Cr 2.27 on admission, was 1.31 on 1/31. Cr 2.25 this AM.  -Daily BMP -Resume home Torsemide  -Holding IV fluids for now, will need to add some if she is not able to orally hydrate herself - Urine urea and creatine to calculate FeUrea (no FeNa while on diuretics)  CHF, intermediate: Echocardiogram on 10/19/17shows EF 51%.  BNP on admission does not support active exacerbation.  At home, patient is on on torsemide 40 mg, carvedilol 12.5 mg twice daily, weight on discharge was 372 pounds. Weight on admission is 363.  Patient is down 9 pounds since hospital discharge on 1/31. -Cont  torsemide and carvedilol  -strict I's and O's, daily weights; see problem AMS -Repeating echo as above  Hypertension, chronic: Controlled  - Resume home amlodipine 5 mg daily, hydralazine 12.5 mg twice daily -  Hold home lisinopril 2.5 mg with AKI  History of dysphagia: Previously eval by speech pathology and advanced to chopped diet.  Given patient's AMS.   -Will  consult speech further recommendations. - Dysphagia 1 diet for now if she passes stroke swallow screen  Epilepsy, chronic:Takes Keppra 250 mg twice daily, Vimpat 100 mg twice daily. - Cont Keppra 250mg  BID - Continue Vimpat 100 mg BID - Neurology consulted as above  A fib, chronic, s/p pacer/AICD:  -Rate control amiodarone 200 mg.   -Holding Xarelto 15 mg daily still - consult Ortho [facility] on Monday  CAD-chronic:,  -Consider increasing atorvastatin to 40 mg -Continue with statin and Imdur   Hx of CVAwith residual hemiplegia. -Has aspirin allergy.  We will not initiate.  Will hold Xarelto as above. -Home atorvastatin 20 mg,consider increasing  Chronic anemia with pancytopenia: Hemoglobin on admission was 8.6.  Hemoglobin 7.8 2/6.  Patient had ALBA status post surgery.  This was likely contributing to current low.  Patient has macrocytosis on admission.  Also has history of iron deficiency anemia.  She is on 325 mg of iron daily at home.  Consider anemia panel this hospitalization.  Obtaining B12 and folate.  - Trend hemoglobin and platelets  - Continue home iron - Transfusion threshold hgb <8 due to CAD   History of pressure ulcerinner thigh RLE, POA, stage 1-2. Does not observe pressure ulcer on lower extremities.  Lower extremities are consistent with significant chronic venous stasis -Wound care consult  FEN/GI: N.p.o. Prophylaxis: Lovenox for DVT prophylaxis  Disposition: continue to monitor on tele  Subjective:  Patient this am is alert but only responds with "yes" and "no". She endorses no chest pain, SOB, difficulty breathing, abdominal pain, nausea, vomiting,   Called patient's SNF nurse, Archie Patten, who provided some history; Since hip fracture in January, she hasn't been normal self, takes longer to respond, mostly yes and no answers. Doesn't like head of bed raised. Yesterday patient wasn't hollering when she was being moved which was unusual for her. RN notes that  she wasn't swallowing pills like she normally does, she was just holding them in her mouth. She also wasn't responding to her name. She did not react when shining pen light in her eyes. Would not follow any commands. She cannot move her lower extremities at baseline. Normally she has lots of swelling in her legs at baseline. Needs spoon feeding.   Objective: Temp:  [97.7 F (36.5 C)-98.6 F (37 C)] 97.9 F (36.6 C) (02/10 0400) Pulse Rate:  [63-86] 65 (02/10 0400) Resp:  [15-22] 15 (02/10 0400) BP: (104-143)/(63-121) 120/66 (02/10 0915) SpO2:  [92 %-100 %] 100 % (02/10 0400) Weight:  [354 lb 11.5 oz (160.9 kg)] 354 lb 11.5 oz (160.9 kg) (02/10 0400) Physical Exam: General: Morbidly obese, NAD, alert when eating or when someone is speaking to her; baseline alert to person, chronically ill appearing Cardiovascular: faint heart sounds, RRR Respiratory: normal work of breathing, difficult to appreciate any lung sounds secondary to body habitus Abdomen: obese abdomen, soft, non distended, non tender  Extremities: 3+ pitting edema from knees down, 1+ pitting edema in thighs Skin: no cyanosis, venous stasis changes to LE bilaterally  Neuro: alert to self but does not respond to other orientation questions, moves b/l upper extremities spontaneously, no movement of b/l LE.   Laboratory: Recent Labs  Lab 11/19/17 1455 11/20/17 0135  WBC 7.5 8.0  HGB 8.6* 9.0*  HCT 28.1* 30.4*  PLT 183 170   Recent  Labs  Lab 11/19/17 1455 11/20/17 0135 11/20/17 0139  NA 142 139  --   K 4.9 4.9  --   CL 105 102  --   CO2 27 27  --   BUN 33* 34*  --   CREATININE 2.21* 2.27* 2.25*  CALCIUM 8.2* 8.2*  --   PROT 5.8*  --   --   BILITOT 0.9  --   --   ALKPHOS 115  --   --   ALT 15  --   --   AST 27  --   --   GLUCOSE 98 118*  --    2/8 - UDS: negative 2/8 - Urea Nitrogen: 279 2/8 - UCx: multiple species 2/9 - TSH: 1.36 2/9 - HgbA1c: 4.9 2/9 - Lipid Panel: Chol. 68, Trigly 38, HDL 37, LDL 23 2/9  - ETOH: <10 2/9 - Troponin I: 0.09 -> 0.09 -> 0.09 2/9 - Vit B12: 1,438 2/9 - Folate: 11.5 2/9 - HIV: non-reactive 2/9 - RPR: non-reactive  Imaging/Diagnostic Tests: Ct Head Wo Contrast  Result Date: 11/20/2017 CLINICAL DATA:  Altered mental status.  Right hip fracture. EXAM: CT HEAD WITHOUT CONTRAST TECHNIQUE: Contiguous axial images were obtained from the base of the skull through the vertex without intravenous contrast. COMPARISON:  CT head without contrast 11/19/2017. FINDINGS: Brain: Left MCA territory infarcts are stable. Volume loss suggests these are remote infarcts. Mild white matter changes are present on the right. The basal ganglia are intact bilaterally. No acute hemorrhage or mass lesion is present. Ventricles are proportionate to the degree of atrophy. No significant extra-axial fluid collection is present. Vascular: Vascular calcifications are present within the cavernous internal carotid arteries bilaterally. There is no hyperdense vessel. Skull: Calvarium is intact. No focal lytic or blastic lesions are present. Sinuses/Orbits: Mild mucosal thickening is present in the inferior maxillary sinuses as before. The paranasal sinuses are otherwise clear. There is some fluid in the mastoid air cells inferiorly. No obstructing nasopharyngeal lesion is present. The globes and orbits are within normal limits. IMPRESSION: 1. Stable remote left MCA territory infarcts. 2. No acute intracranial abnormality or significant interval change. Electronically Signed   By: Marin Robertshristopher  Mattern M.D.   On: 11/20/2017 16:30    Arlyce HarmanLockamy, Kenyah Luba, DO 11/21/2017, 10:17 AM PGY-3, [facility] FPTS Intern pager: [phone], text pages welcome

## 2017-11-21 NOTE — Progress Notes (Signed)
PT Cancellation Note  Patient Details Name: Alexandra Potts MRN: [id number] DOB: 12/18/1945   Cancelled Treatment:     Therapy attempted to see patient at 1100. Patient is still on bed rest. Per nursing patient is Aox1. Nursing will talk to MD and see if patient is appropriate to come off bedrest. Therapy will follow up in the afternoon if time permits   Dessie Comaavid J Chukwudi Ewen PT DPT  11/21/2017, 12:31 PM

## 2017-11-22 ENCOUNTER — Inpatient Hospital Stay (HOSPITAL_COMMUNITY): Payer: Medicare Other

## 2017-11-22 DIAGNOSIS — I5030 Unspecified diastolic (congestive) heart failure: Secondary | ICD-10-CM | POA: Diagnosis not present

## 2017-11-22 DIAGNOSIS — N179 Acute kidney failure, unspecified: Secondary | ICD-10-CM | POA: Diagnosis not present

## 2017-11-22 DIAGNOSIS — R410 Disorientation, unspecified: Secondary | ICD-10-CM | POA: Diagnosis not present

## 2017-11-22 LAB — LEVETIRACETAM LEVEL: Levetiracetam Lvl: 17.7 ug/mL (ref 10.0–40.0)

## 2017-11-22 LAB — ABO/RH: ABO/RH(D): A POS

## 2017-11-22 LAB — BASIC METABOLIC PANEL
Anion gap: 12 (ref 5–15)
BUN: 38 mg/dL — AB (ref 6–20)
CALCIUM: 8.2 mg/dL — AB (ref 8.9–10.3)
CO2: 26 mmol/L (ref 22–32)
Chloride: 106 mmol/L (ref 101–111)
Creatinine, Ser: 2.27 mg/dL — ABNORMAL HIGH (ref 0.44–1.00)
GFR calc Af Amer: 24 mL/min — ABNORMAL LOW (ref >60)
GFR, EST NON AFRICAN AMERICAN: 20 mL/min — AB (ref >60)
GLUCOSE: 139 mg/dL — AB (ref 65–99)
POTASSIUM: 4.8 mmol/L (ref 3.5–5.1)
Sodium: 144 mmol/L (ref 135–145)

## 2017-11-22 LAB — CBC
HEMATOCRIT: 26.4 % — AB (ref 36.0–46.0)
Hemoglobin: 7.9 g/dL — ABNORMAL LOW (ref 12.0–15.0)
MCH: 31.1 pg (ref 26.0–34.0)
MCHC: 29.9 g/dL — AB (ref 30.0–36.0)
MCV: 103.9 fL — ABNORMAL HIGH (ref 78.0–100.0)
Platelets: 140 10*3/uL — ABNORMAL LOW (ref 150–400)
RBC: 2.54 MIL/uL — ABNORMAL LOW (ref 3.87–5.11)
RDW: 16.8 % — AB (ref 11.5–15.5)
WBC: 4.5 10*3/uL (ref 4.0–10.5)

## 2017-11-22 LAB — ECHOCARDIOGRAM COMPLETE
Height: 63 in
Weight: 5724.91 oz

## 2017-11-22 MED ORDER — RESOURCE THICKENUP CLEAR PO POWD
ORAL | Status: DC | PRN
  Filled 2017-11-22: qty 125

## 2017-11-22 MED ORDER — SODIUM CHLORIDE 0.9 % IV SOLN: Freq: Once | INTRAVENOUS | Status: DC

## 2017-11-22 NOTE — Consult Note (Signed)
WOC Nurse wound consult note Reason for Consult: MASD and venous stasis  Patient is not able to give me much history. I have asked her if she has every worn compression stockings but it is hard to get information from her.  Wound type: MASD inframammary and under her pannus, clinical staff are implemented antimicrobial wicking fabric today for these areas. Her buttock and thighs look intact  Chronic venous stasis, with edema and bilaterally hemosiderin staining present, some mild cellulitis on the right LE. She has some mild weeping on the medial aspect of the left leg. She really needs compression but with her current vascular status would need ABI's to determine safety for compression therapy.   Pressure Injury POA: No  Dressing procedure/placement/frequency: No other topical care needed for the LEs  Consider ABI if compression therapy desired, patient is reported to not be ambulatory therefore she would be a better candidate in 4 layer sustained compression vs unnas boots.   Reconsult if compression therapy desired.

## 2017-11-22 NOTE — Progress Notes (Signed)
Family Medicine Teaching Service Daily Progress Note Intern Pager: [phone]  Patient name: Alexandra Potts Medical record number: [id number] Date of birth: [birth year] Age: 72 y.o. Gender: female  Primary Care Provider: Patient, No Pcp Per Consultants: Neuro Code Status: Full   Pt Overview and Major Events to Date:  2/8: Admit to FPTS for AMS  Assessment and Plan: Alexandra Potts is a 72 y.o. female presenting with altered mental status from her nursing home.  PMH is significant for right hip fracture status post s/p right hip arthroplasty 11/02/17, CAD, angina pectoralis, CKD 4, Alzheimer's disease, CHF w/ biventricular ICD, hypertension, pancytopenia, atrial fibrillation, pancytopenia, epilepsy, CVA of MCA with hemiplegia  Altered mental status: Resolved. Medically stable to transfer back to SNF today. Unclear etiology of event that prompted admission. This am she is alert and talkative.  According to SNF nurse, baseline mental status since hip surgery is only oriented to self, moves UE bilaterally but not LE, which I observed today. No signs of acute stroke on admission CT. AMS likely multifactorial but differentials remain broad; pain from hip fracture vs stroke vs seizure vs AKI (Cr 2.25 yesterday). No observed seizures this far while inpatient. B12 actually elevated, normal folate. EtOH negative. UDS unremarkable. Normal TSH to 1.3. EEG was consistent with generalized non-specific cerebral dysfunction (encephalopathy).  -Neurology on board, appreciate recommendations; cont Keppra 250 BID and Vimpat 100 BID -Blood cultures NGTD x 2 days; urine cultures multiple species -Strict I's and O's & daily weights: No output recorded from yesterday; wt is up 3lbs from 2/10; 357 - ECHO still pending -Cont Neurochecks q.4 hours and seizure precautions -PT/OT/speech -Cont to hold sedating meds while altered  Chronic anemia with pancytopenia: Hemoglobin on admission was 8.6.  Hemoglobin 7.8 2/6.  Patient  had ALBA status post surgery.  This was likely contributing to current low.  Patient has macrocytosis on admission.  Also has history of iron deficiency anemia.  She is on 325 mg of iron daily at home.  Consider anemia panel this hospitalization.  Obtaining B12 and folate.  - Trend hemoglobin and platelets  - Continue home iron - Type and screen with Transfusion today due to threshold hgb <8 due to CAD. Hgb this am was 7.9   Right hip fracture of right femoral neck: Status post s/p right hip arthroplasty 11/02/17, Discharge from hospital with non weight bearing and orthotic.Patient was to complete 7 day course of doxycycline for drainage from incision site.  Not on current med list.  Unsure if patient finished.  Patient was anticoagulated with Xarelto for atrial fibrillation, however this was discontinued upon discharge of her last hospitalization until she was cleared by outpatient orthopedics. On percocet at home 5-3 25 -Continue leg brace -Will need outpatient follow up next week with ortho -Continue percocet 5 mg BID PRN  AKI on CKD: Likely due to worsening cardiorenal syndrome due to CHF, may be worse due to stress of current illness question. Additionally could be prerenal etiology from dehydration (weight is down 9 lbs from January). Cr was 1.31 on 1/31. Cr 2.27 this AM.  -Daily BMP -Resume home Torsemide  -She is able to talk in oral fluids so will not start IV.  CHF, intermediate: Echocardiogram on 10/19/17shows EF 51%.  BNP on admission does not support active exacerbation.  At home, patient is on on torsemide 40 mg, carvedilol 12.5 mg twice daily, weight on discharge was 372 pounds. Weight on admission is 363.  Patient is down 9 pounds since hospital discharge  on 1/31. -Cont  torsemide and carvedilol  -strict I's and O's, daily weights; see problem AMS -Repeating echo as above  Hypertension, chronic: Controlled  - Resume home amlodipine 5 mg daily, hydralazine 12.5 mg twice  daily - Hold home lisinopril 2.5 mg with AKI  History of dysphagia: Previously eval by speech pathology and advanced to chopped diet.  Given patient's AMS.   -Will consult speech further recommendations. - Dysphagia 1 diet for now if she passes stroke swallow screen  Epilepsy, chronic:Takes Keppra 250 mg twice daily, Vimpat 100 mg twice daily. - Cont Keppra 250mg  BID - Continue Vimpat 100 mg BID - Neurology consulted as above  A fib, chronic, s/p pacer/AICD:  -Rate control amiodarone 200 mg.   -Holding Xarelto 15 mg daily still - consult Ortho [facility] on Monday  CAD-chronic:,  -Consider increasing atorvastatin to 40 mg -Continue with statin and Imdur   Hx of CVAwith residual hemiplegia. -Has aspirin allergy.  We will not initiate.  Will hold Xarelto as above. -Home atorvastatin 20 mg,consider increasing  History of pressure ulcerinner thigh RLE, POA, stage 1-2. Does not observe pressure ulcer on lower extremities.  Lower extremities are consistent with significant chronic venous stasis -Wound care consult  FEN/GI: Dysphagia 1 Prophylaxis: Lovenox for DVT prophylaxis  Disposition: back to SNF  Subjective:  Patient this am is more alert and responds to her name. However, her verbal responses otherwise are limited to "yes" and "I'm doing alright" and "no". She is not a reliable historian but appears to not be in pain and denies she is in pain this morning.  Objective: Temp:  [97.5 F (36.4 C)-98.2 F (36.8 C)] 97.5 F (36.4 C) (02/11 0350) Pulse Rate:  [62-67] 62 (02/11 0350) Resp:  [13-15] 13 (02/11 0350) BP: (93-120)/(56-74) 98/74 (02/11 0350) SpO2:  [99 %-100 %] 100 % (02/11 0350) Weight:  [357 lb 12.9 oz (162.3 kg)] 357 lb 12.9 oz (162.3 kg) (02/11 0324) Physical Exam: General: Morbidly obese, NAD, alert when eating or when someone is speaking to her; baseline alert to person, chronically ill appearing Cardiovascular: faint heart sounds, RRR Respiratory:  normal work of breathing, difficult to appreciate any lung sounds secondary to body habitus Abdomen: obese abdomen, soft, non distended, non tender  Extremities: 2+ pitting edema from knees down, 1+ pitting edema in thighs Skin: no cyanosis, venous stasis changes to LE bilaterally  Neuro: alert to self but does not respond to other orientation questions, moves b/l upper extremities spontaneously, no movement of b/l LE.   Laboratory: Recent Labs  Lab 11/20/17 0135 11/21/17 1416 11/22/17 0323  WBC 8.0 4.7 4.5  HGB 9.0* 7.8* 7.9*  HCT 30.4* 26.2* 26.4*  PLT 170 143* 140*   Recent Labs  Lab 11/19/17 1455 11/20/17 0135 11/20/17 0139 11/21/17 1129 11/22/17 0323  NA 142 139  --  141 144  K 4.9 4.9  --  4.5 4.8  CL 105 102  --  106 106  CO2 27 27  --  23 26  BUN 33* 34*  --  37* 38*  CREATININE 2.21* 2.27* 2.25* 2.18* 2.27*  CALCIUM 8.2* 8.2*  --  8.2* 8.2*  PROT 5.8*  --   --   --   --   BILITOT 0.9  --   --   --   --   ALKPHOS 115  --   --   --   --   ALT 15  --   --   --   --  AST 27  --   --   --   --   GLUCOSE 98 118*  --  110* 139*   2/8 - UDS: negative 2/8 - Urea Nitrogen: 279 2/8 - UCx: multiple species 2/9 - TSH: 1.36 2/9 - HgbA1c: 4.9 2/9 - Lipid Panel: Chol. 68, Trigly 38, HDL 37, LDL 23 2/9 - ETOH: <10 2/9 - Troponin I: 0.09 -> 0.09 -> 0.09 2/9 - Vit B12: 1,438 2/9 - Folate: 11.5 2/9 - HIV: non-reactive 2/9 - RPR: non-reactive  Imaging/Diagnostic Tests: No results found.   2/8 - CT Head w/o Contrast: IMPRESSION: No acute finding by CT. Old infarctions in the left MCA territory as described above.  2/9 - CT Head w/o Contrast: IMPRESSION: 1. Stable remote left MCA territory infarcts. 2. No acute intracranial abnormality or significant interval change.  2/9 - Echo: pending read  2/9 - EEG: This EEG is consistent with a  generalized non-specific cerebral dysfunction(encephalopathy). There was no seizure or seizure predisposition recorded on this  study. Please note that a normal EEG does not preclude the possibility of epilepsy.   Arlyce Harman, DO 11/22/2017, 7:22 AM PGY-3, [facility] FPTS Intern pager: [phone], text pages welcome

## 2017-11-22 NOTE — NC FL2 (Signed)
[place] MEDICAID FL2 LEVEL OF CARE SCREENING TOOL     IDENTIFICATION  Patient Name: Alexandra Potts Birthdate: 11/02/1945 Sex: female Admission Date (Current Location): 11/19/2017  [facility] [place]   Facility and Address:  [facility], [address], [place], [place] [address]      Provider Number: [id number]  Attending Physician Name and Address:  Latrelle DodrillMcIntyre, Brittany J, MD  Relative Name and Phone Number:       Current Level of Care: Hospital Recommended Level of Care: Skilled Nursing Facility Prior Approval Number:    Date Approved/Denied:   PASRR Number:    Discharge Plan: SNF    Current Diagnoses: Patient Active Problem List   Diagnosis Date Noted  . Altered mental status 11/19/2017    Orientation RESPIRATION BLADDER Height & Weight     Self, Place  Normal Incontinent, External catheter Weight: (!) 162.3 kg (357 lb 12.9 oz) Height:  5\' 3"  (160 cm)(Estimate: Ht's avaible in care everywhere range 5'2-5'6.)  BEHAVIORAL SYMPTOMS/MOOD NEUROLOGICAL BOWEL NUTRITION STATUS      Continent Diet(Please see DC Summary)  AMBULATORY STATUS COMMUNICATION OF NEEDS Skin   Limited Assist Verbally Surgical wounds(Closed incision on hip)                       Personal Care Assistance Level of Assistance  Bathing, Feeding, Dressing Bathing Assistance: Maximum assistance Feeding assistance: Limited assistance Dressing Assistance: Limited assistance     Functional Limitations Info             SPECIAL CARE FACTORS FREQUENCY                       Contractures      Additional Factors Info  Code Status, Allergies, Psychotropic Code Status Info: Full Allergies Info: Aspirin Psychotropic Info: Zoloft         Current Medications (11/22/2017):  This is the current hospital active medication list Current Facility-Administered Medications  Medication Dose Route Frequency Provider Last Rate Last Dose  . 0.9 %   sodium chloride infusion   Intravenous Once Leland HerYoo, Elsia J, DO      . amiodarone (PACERONE) tablet 200 mg  200 mg Oral BID Garnette Gunnerhompson, Aaron B, MD   200 mg at 11/22/17 1000  . amLODipine (NORVASC) tablet 5 mg  5 mg Oral Daily Garnette Gunnerhompson, Aaron B, MD   5 mg at 11/22/17 1000  . atorvastatin (LIPITOR) tablet 20 mg  20 mg Oral QHS Garnette Gunnerhompson, Aaron B, MD   20 mg at 11/21/17 2147  . bisacodyl (DULCOLAX) EC tablet 10 mg  10 mg Oral Daily PRN Garnette Gunnerhompson, Aaron B, MD      . calcium-vitamin D (OSCAL WITH D) 500-200 MG-UNIT per tablet 1 tablet  1 tablet Oral BID Garnette Gunnerhompson, Aaron B, MD   1 tablet at 11/22/17 1000  . carvedilol (COREG) tablet 12.5 mg  12.5 mg Oral BID WC Garnette Gunnerhompson, Aaron B, MD   12.5 mg at 11/22/17 0755  . enoxaparin (LOVENOX) injection 40 mg  40 mg Subcutaneous Q24H Garnette Gunnerhompson, Aaron B, MD   40 mg at 11/22/17 1000  . feeding supplement (PRO-STAT SUGAR FREE 64) liquid 30 mL  30 mL Oral BID WC Garnette Gunnerhompson, Aaron B, MD   30 mL at 11/22/17 0755  . ferrous sulfate tablet 325 mg  325 mg Oral Daily Garnette Gunnerhompson, Aaron B, MD   325 mg at 11/22/17 1000  . hydrALAZINE (APRESOLINE) tablet  12.5 mg  12.5 mg Oral BID Fanny Bien B, MD   12.5 mg at 11/22/17 1000  . hydrocerin (EUCERIN) cream 1 application  1 application Topical Daily Garnette Gunner, MD   1 application at 11/22/17 0755  . isosorbide dinitrate (ISORDIL) tablet 20 mg  20 mg Oral TID Garnette Gunner, MD   20 mg at 11/22/17 1000  . lacosamide (VIMPAT) tablet 100 mg  100 mg Oral BID Garnette Gunner, MD   100 mg at 11/22/17 1000  . levETIRAcetam (KEPPRA) tablet 250 mg  250 mg Oral Q12H Garnette Gunner, MD   250 mg at 11/22/17 0755  . nitroGLYCERIN (NITROSTAT) SL tablet 0.4 mg  0.4 mg Sublingual Q5 min PRN Garnette Gunner, MD      . oxyCODONE-acetaminophen (PERCOCET/ROXICET) 5-325 MG per tablet 1 tablet  1 tablet Oral BID PRN Garnette Gunner, MD      . polyvinyl alcohol (LIQUIFILM TEARS) 1.4 % ophthalmic solution 1 drop  1 drop Both Eyes Daily Garnette Gunner, MD   1 drop at 11/22/17 1000  . pregabalin (LYRICA) capsule 25 mg  25 mg Oral Daily Garnette Gunner, MD   25 mg at 11/22/17 1000  . RESOURCE THICKENUP CLEAR   Oral PRN Latrelle Dodrill, MD      . sertraline (ZOLOFT) tablet 25 mg  25 mg Oral Daily Garnette Gunner, MD   25 mg at 11/22/17 1000  . torsemide (DEMADEX) tablet 40 mg  40 mg Oral Daily Garnette Gunner, MD   40 mg at 11/22/17 1000  . Zinc Oxide (TRIPLE PASTE) 12.8 % ointment 1 application  1 application Topical BID Garnette Gunner, MD   1 application at 11/22/17 1000     Discharge Medications: Please see discharge summary for a list of discharge medications.  Relevant Imaging Results:  Relevant Lab Results:   Additional Information    Mearl Latin, LCSWA

## 2017-11-22 NOTE — Evaluation (Signed)
Occupational Therapy Evaluation and Discharge Patient Details Name: Alexandra Potts MRN: [id number] DOB: 11-Jul-1946 Today's Date: 11/22/2017    History of Present Illness Pt admitted from [facility] with AMS. PMH: R hip fx s/p arthroplasty 11/02/17, CAD, angina, CKD 4, Alzheimers, CHF, HTN, afib, epilepsy, CVA, anemia.   Clinical Impression   Pt requires total assist for all ADL and mobility. Limited by B LE pain. Attempts to reach SNF to find out PLOF when unanswered. Likely pt is at her baseline. No acute OT needs.    Follow Up Recommendations  SNF;Supervision/Assistance - 24 hour    Equipment Recommendations       Recommendations for Other Services       Precautions / Restrictions Precautions Precautions: Fall Restrictions Weight Bearing Restrictions: Yes RLE Weight Bearing: Non weight bearing      Mobility Bed Mobility               General bed mobility comments: any attempt to assist pt with movement was met with moaning and report of pain  Transfers                 General transfer comment: pt will require lift equipment    Balance                                           ADL either performed or assessed with clinical judgement   ADL Overall ADL's : At baseline                                       General ADL Comments: pt requires total care     Vision         Perception     Praxis      Pertinent Vitals/Pain Pain Assessment: Faces Faces Pain Scale: Hurts even more Pain Location: B LEs Pain Descriptors / Indicators: Discomfort;Moaning;Grimacing Pain Intervention(s): Monitored during session;Repositioned;Limited activity within patient's tolerance     Hand Dominance     Extremity/Trunk Assessment Upper Extremity Assessment Upper Extremity Assessment: Generalized weakness;RUE deficits/detail;LUE deficits/detail RUE Deficits / Details: tightness in shoulder, passive flexion to 90  degrees, full ROM elbow, 4-/5 gross grip RUE Coordination: decreased fine motor;decreased gross motor LUE Deficits / Details: tightness in shoulder, PROM to 90 degrees, full elbow ROM, 4-/5 gross grip LUE Coordination: decreased fine motor;decreased gross motor(can bring hand to mouth)   Lower Extremity Assessment Lower Extremity Assessment: Defer to PT evaluation   Cervical / Trunk Assessment Cervical / Trunk Assessment: Other exceptions(obesity)   Communication Communication Communication: Expressive difficulties(speaks in short phrases)   Cognition Arousal/Alertness: Awake/alert Behavior During Therapy: Flat affect Overall Cognitive Status: No family/caregiver present to determine baseline cognitive functioning                                 General Comments: pt with hx of dementia   General Comments       Exercises     Shoulder Instructions      Home Living Family/patient expects to be discharged to:: Skilled nursing facility     Type of Home: [place]  Prior Functioning/Environment          Comments: attempted to contact SNF to determine PLOF, but no answer        OT Problem List: Decreased strength;Decreased range of motion;Decreased activity tolerance;Impaired balance (sitting and/or standing);Decreased coordination;Pain;Obesity;Decreased cognition      OT Treatment/Interventions:      OT Goals(Current goals can be found in the care plan section) Acute Rehab OT Goals Patient Stated Goal: did not state  OT Frequency:     Barriers to D/C:            Co-evaluation PT/OT/SLP Co-Evaluation/Treatment: Yes Reason for Co-Treatment: For patient/therapist safety   OT goals addressed during session: Strengthening/ROM      AM-PAC PT "6 Clicks" Daily Activity     Outcome Measure Help from another person eating meals?: Total Help from another person taking care of personal  grooming?: Total Help from another person toileting, which includes using toliet, bedpan, or urinal?: Total Help from another person bathing (including washing, rinsing, drying)?: Total Help from another person to put on and taking off regular upper body clothing?: Total Help from another person to put on and taking off regular lower body clothing?: Total 6 Click Score: 6   End of Session Nurse Communication: Need for lift equipment  Activity Tolerance: Patient limited by pain Patient left: in bed;with call bell/phone within reach;with bed alarm set(echo in room)  OT Visit Diagnosis: Muscle weakness (generalized) (M62.81);Pain                Time: 1345-1400 OT Time Calculation (min): 15 min Charges:  OT General Charges $OT Visit: 1 Visit OT Evaluation $OT Eval Moderate Complexity: 1 Mod G-Codes:     Malka So 11/22/2017, 2:45 PM  11/22/2017 Nestor Lewandowsky, OTR/L Pager: [phone]

## 2017-11-22 NOTE — Clinical Social Work Note (Signed)
Clinical Social Work Assessment  Patient Details  Name: Josem Kaufmannlaine Weygandt MRN: [id number] Date of Birth: 04/22/1946  Date of referral:  11/22/17               Reason for consult:  Facility Placement                Permission sought to share information with:  Facility Industrial/product designerContact Representative Permission granted to share information::  Yes, Verbal Permission Granted  Name::        Agency::  [facility]  Relationship::     Contact Information:     Housing/Transportation Living arrangements for the past 2 months:  Skilled Nursing Facility Source of Information:  Patient, Facility Patient Interpreter Needed:  None Criminal Activity/Legal Involvement Pertinent to Current Situation/Hospitalization:  No - Comment as needed Significant Relationships:  None Lives with:  Facility Resident Do you feel safe going back to the place where you live?  Yes Need for family participation in patient care:  No (Coment)  Care giving concerns:  CSW received consult for possible SNF placement at time of discharge. CSW spoke with patient regarding PT recommendation of SNF placement at time of discharge. She is confused but has no family listed on her chart. Per patient's SNF facility ([facility]), she has been there for long term care. SW to continue to follow and assist with discharge planning needs.   Social Worker assessment / plan:  CSW spoke with patient concerning possibility of [facility]  Employment status:  Retired Database administratornsurance information:  Managed Medicare, [place] PT Recommendations:  Not assessed at this time Information / Referral to community resources:  Skilled Nursing Facility  Patient/Family's Response to care:  Patient recognizes need for return to [facility].  Patient/Family's Understanding of and Emotional Response to Diagnosis, Current Treatment, and Prognosis:  Patient/family is realistic regarding therapy needs and expressed being hopeful for  return SNF placement. Patient did not seem to possess an understanding of CSW role and discharge process as well as medical condition due to confusion at baseline. No questions/concerns about plan or treatment.    Emotional Assessment Appearance:  Appears stated age Attitude/Demeanor/Rapport:  Other(Pleasantly confused) Affect (typically observed):  Accepting Orientation:  Oriented to Self, Oriented to Place Alcohol / Substance use:  Not Applicable Psych involvement (Current and /or in the community):  No (Comment)  Discharge Needs  Concerns to be addressed:  Care Coordination Readmission within the last 30 days:  No Current discharge risk:  None Barriers to Discharge:  No Barriers Identified   Mearl Latinadia S Seith Aikey, LCSWA 11/22/2017, 2:27 PM

## 2017-11-22 NOTE — Progress Notes (Signed)
PT Cancellation Note  Patient Details Name: Alexandra Potts MRN: [id number] DOB: 04/17/1946   Cancelled Treatment:    Reason Eval/Treat Not Completed: (P) Patient not medically ready Pt continues to be on active bed rest orders. PT will continue to monitor for evaluation until bed rest orders are removed.  Malinda Mayden B. Beverely RisenVan Fleet PT, DPT Acute Rehabilitation  [phone] Pager [phone]     Elon Alaslizabeth B Van [facility] 11/22/2017, 8:23 AM

## 2017-11-22 NOTE — Progress Notes (Signed)
  Echocardiogram 2D Echocardiogram has been performed.  Leta JunglingCooper, Katriel Cutsforth M 11/22/2017, 2:32 PM

## 2017-11-22 NOTE — Progress Notes (Signed)
Patient will DC to: [facility] Anticipated DC date: 11/22/17 Family notified: no family found Transport by: RN to call PTAR after blood given (after 5pm: [phone])   Per MD patient ready for DC to [facility]. RN, patient, patient's family, and facility notified of DC. Discharge Summary sent to facility. RN given number for report [phone]). DC packet on chart.    CSW signing off.  Cristobal GoldmannNadia Aerilyn Slee, [place] Clinical Social Worker [phone]

## 2017-11-22 NOTE — Progress Notes (Addendum)
CSW received updated from SNF, patient has no family and their social worker is working on getting guardianship. Per SNF, patient's ssn is [phone].  Osborne Cascoadia Cortlynn Hollinsworth LCSW [phone]

## 2017-11-22 NOTE — Progress Notes (Signed)
Attempted to call report x2. Hung up on once, second time on hold for 6 minutes. Will attempt once more. PTAR called for transport.

## 2017-11-22 NOTE — Progress Notes (Signed)
OT Cancellation Note  Patient Details Name: Alexandra Potts MRN: [id number] DOB: 09/05/1946   Cancelled Treatment:    Reason Eval/Treat Not Completed: Medical issues which prohibited therapy(Pt with active bed rest order. )  Evern BioMayberry, Bradford Cazier Lynn 11/22/2017, 8:15 AM  11/22/2017 Martie RoundJulie Tirrell Buchberger, OTR/L Pager: [phone]

## 2017-11-22 NOTE — Plan of Care (Signed)
  Progressing Nutrition: Risk of aspiration will decrease 11/22/2017 0520 - Progressing by Burtis Junesrewery, Jarryn Altland, RN Continues to tolerate dysphagia diet with nectar thick liquids Intracerebral Hemorrhage Tissue Perfusion: Complications of Intracerebral Hemorrhage will be minimized 11/22/2017 0520 - Progressing by Burtis Junesrewery, Sherard Sutch, RN  Patient is able to state name and follow simple commands

## 2017-11-22 NOTE — Evaluation (Signed)
Physical Therapy Evaluation Patient Details Name: Alexandra Potts MRN: [id number] DOB: [birth year] Today's Date: 11/22/2017   History of Present Illness  Pt admitted from [facility] with AMS. PMH: R hip fx s/p arthroplasty 11/02/17, CAD, angina, CKD 4, Alzheimers, CHF, HTN, afib, epilepsy, CVA, anemia.  Clinical Impression  Patient evaluated by Physical Therapy and refused to move legs or participate in therapy. Pt unable to give any information about prior level of function. PT believes pt to be at baseline level of function and is not appropriate for continued acute PT. PT recommends d/c'd back to her SNF after hospitalization.     Follow Up Recommendations SNF          Precautions / Restrictions Precautions Precautions: Fall Restrictions Weight Bearing Restrictions: Yes RLE Weight Bearing: Non weight bearing      Mobility  Bed Mobility               General bed mobility comments: any attempt to assist pt with movement was met with moaning and report of pain  Transfers                 General transfer comment: pt will require lift equipment           Pertinent Vitals/Pain Pain Assessment: Faces Faces Pain Scale: Hurts even more Pain Location: B LEs Pain Descriptors / Indicators: Discomfort;Moaning;Grimacing Pain Intervention(s): Monitored during session;Limited activity within patient's tolerance    Home Living Family/patient expects to be discharged to:: Skilled nursing facility     Type of Home: [place]                Prior Function           Comments: unknown        Extremity/Trunk Assessment   Upper Extremity Assessment Upper Extremity Assessment: Defer to OT evaluation RUE Deficits / Details: tightness in shoulder, passive flexion to 90 degrees, full ROM elbow, 4-/5 gross grip RUE Coordination: decreased fine motor;decreased gross motor LUE Deficits / Details: tightness in shoulder, PROM to 90  degrees, full elbow ROM, 4-/5 gross grip LUE Coordination: decreased fine motor;decreased gross motor(can bring hand to mouth)    Lower Extremity Assessment Lower Extremity Assessment: LLE deficits/detail;RLE deficits/detail RLE Deficits / Details: painful to touch would not allow and ROM of R LE RLE: Unable to fully assess due to pain LLE Deficits / Details: able to wiggle toes, would not allow PT to range any other joints due to pain  LLE: Unable to fully assess due to pain    Cervical / Trunk Assessment Cervical / Trunk Assessment: Other exceptions(obesity)  Communication   Communication: Expressive difficulties(speaks in short phrases)  Cognition Arousal/Alertness: Awake/alert Behavior During Therapy: Flat affect Overall Cognitive Status: No family/caregiver present to determine baseline cognitive functioning                                 General Comments: pt with hx of dementia             Assessment/Plan    PT Assessment Patent does not need any further PT services(Pt appears to be at baseline level of function )         PT Goals (Current goals can be found in the Care Plan section)  Acute Rehab PT Goals Patient Stated Goal: did not state PT Goal Formulation: Patient unable to participate in goal setting  Co-evaluation PT/OT/SLP Co-Evaluation/Treatment: Yes Reason for Co-Treatment: For patient/therapist safety PT goals addressed during session: Strengthening/ROM OT goals addressed during session: Strengthening/ROM       AM-PAC PT "6 Clicks" Daily Activity  Outcome Measure Difficulty turning over in bed (including adjusting bedclothes, sheets and blankets)?: Unable Difficulty moving from lying on back to sitting on the side of the bed? : Unable Difficulty sitting down on and standing up from a chair with arms (e.g., wheelchair, bedside commode, etc,.)?: Unable Help needed moving to and from a bed to chair (including a  wheelchair)?: Total Help needed walking in hospital room?: Total Help needed climbing 3-5 steps with a railing? : Total 6 Click Score: 6    End of Session   Activity Tolerance: Patient limited by pain Patient left: in bed;with call bell/phone within reach;Other (comment)(ECHO in room ) Nurse Communication: Mobility status;Need for lift equipment PT Visit Diagnosis: Muscle weakness (generalized) (M62.81);Pain;Hemiplegia and hemiparesis;History of falling (Z91.81);Other abnormalities of gait and mobility (R26.89) Hemiplegia - Right/Left: Right Pain - Right/Left: (bilateral LE ) Pain - part of body: Leg    Time: 7011-0034 PT Time Calculation (min) (ACUTE ONLY): 14 min   Charges:   PT Evaluation $PT Eval Moderate Complexity: 1 Mod     PT G Codes:        Alexandra Potts B. Migdalia Dk PT, DPT Acute Rehabilitation  [phone] Pager [phone]    [place] 11/22/2017, 3:01 PM

## 2017-11-22 NOTE — Discharge Summary (Signed)
Family Medicine Teaching [facility] Discharge Summary  Patient name: Alexandra Potts Medical record number: [id number] Date of birth: 04-Apr-1946 Age: 72 y.o. Gender: female Date of Admission: 11/19/2017  Date of Discharge: 11/22/2017 Admitting Physician: Latrelle Dodrill, MD  Primary Care Provider: Patient, No Pcp Per Consultants: None  Indication for Hospitalization:   Altered Mental Status  Discharge Diagnoses/Problem List:   Right Hip Fracture s/p arthroplasty CAD CKD4 Alzheimer's disease CHF w/biventricular ICD HTN Pancytopenia Atrial Fibrillation Epilepsy CVA of MCA with hemiplegia  Disposition: SNF  Discharge Condition: medically stable  Discharge Exam:   General: Morbidly obese, NAD, alert when eating or when someone is speaking to her; baseline alert to person, chronically ill appearing Cardiovascular: faint heart sounds, RRR Respiratory: normal work of breathing, difficult to appreciate any lung sounds secondary to body habitus Abdomen: obese abdomen, soft, non distended, non tender  Extremities: 2+ pitting edema from knees down, 1+ pitting edema in thighs Skin: no cyanosis, venous stasis changes to LE bilaterally  Neuro: alert to self but does not respond to other orientation questions, moves b/l upper extremities spontaneously, no movement of b/l LE.   Brief Hospital Course:  Alexandra Potts is a 71y/o female who was sent to the [facility] ED due to "not acting like herself" at her nursing home facility that was noticed by the staff. Her baseline is oriented to self only. She was admitted and Neurology performed a CT of her head which was negative for any acute intracranial process and her EEG was abnormal but non-specific with no seizure like activity. No seizure like activity was observed during the course of her hospital stay. She remained stable throughout her hospital stay and her mental status returned to baseline. Neurology recommended continuing her  medications for seizure. Her lab work for intoxication from substances were all normal.  It is thought that this recent episode of Altered Mental Status is most likely multifactorial due to worsening Alzheimer's disease, complication of dehydration, and use of sedating medications.  Issues for Follow Up:  1. Alexandra Drew was wearing an ortho brace on her right leg following her right hip fracture surgery. She will need to resume this at [facility] and have follow up with Orthopedic Surgery at [facility]. 2. Alexandra Hinkson was on Xarelto for Atrial Fibrillation prior to her surgery and it was stopped and not restarted. She should follow up with orthopedic surgery at [facility] to determine when to restart this medication and have discussion of the risks vs benefits. 3. Her Hgb was recorded at 7.9 on the day of discharge. It is below the threshold for transfusion at 8 but it was within the margin of error and she exhibited no signs or symptoms of her chronic anemia. She should have a CBC rechecked in a few weeks and transfused if still below the threshold of 8. Complicating matters is that she cannot consent and she has no family or legal guardian to give consent for her. 4. Alexandra Schoeller was found to have an AKI on admission that did not worse and was likely due to dehydration from poor oral intake from her noted decrease in awareness. Her oral intake improved and she should have her Creatnine rechecked in a week.  Significant Procedures:   Significant Labs and Imaging:  Recent Labs  Lab 11/20/17 0135 11/21/17 1416 11/22/17 0323  WBC 8.0 4.7 4.5  HGB 9.0* 7.8* 7.9*  HCT 30.4* 26.2* 26.4*  PLT 170 143* 140*   Recent Labs  Lab 11/19/17  1455 11/19/17 1549 11/20/17 0135 11/20/17 0139 11/21/17 1129 11/22/17 0323  NA 142  --  139  --  141 144  K 4.9  --  4.9  --  4.5 4.8  CL 105  --  102  --  106 106  CO2 27  --  27  --  23 26  GLUCOSE 98  --  118*  --  110* 139*  BUN 33*  --  34*  --  37* 38*  CREATININE  2.21*  --  2.27* 2.25* 2.18* 2.27*  CALCIUM 8.2*  --  8.2*  --  8.2* 8.2*  MG  --  2.1  --   --   --   --   PHOS  --  5.2*  --   --   --   --   ALKPHOS 115  --   --   --   --   --   AST 27  --   --   --   --   --   ALT 15  --   --   --   --   --   ALBUMIN 2.1*  --   --   --   --   --     2/8 - UDS: negative 2/8 - Urea Nitrogen: 279 2/8 - UCx: multiple species 2/9 - TSH: 1.36 2/9 - HgbA1c: 4.9 2/9 - Lipid Panel: Chol. 68, Trigly 38, HDL 37, LDL 23 2/9 - ETOH: <10 2/9 - Troponin I: 0.09 -> 0.09 -> 0.09 2/9 - Vit B12: 1,438 2/9 - Folate: 11.5 2/9 - HIV: non-reactive 2/9 - RPR: non-reactive  2/8 - CT Head w/o Contrast: IMPRESSION: No acute finding by CT. Old infarctions in the left MCA territory as described above.  2/9 - CT Head w/o Contrast: IMPRESSION: 1. Stable remote left MCA territory infarcts. 2. No acute intracranial abnormality or significant interval change.  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 There was no seizure or seizure predisposition recorded on this study. Please note that a normal EEG does not preclude the possibility of epilepsy.   Results/Tests Pending at Time of Discharge:   None  Discharge Medications:  Allergies as of 11/22/2017      Reactions   Aspirin Other (See Comments)   Unknown reaction - listed on MAR      Medication List    TAKE these medications   acetaminophen 325 MG tablet Commonly known as:  TYLENOL Take 650 mg by mouth every 4 (four) hours as needed for fever (pain).   amiodarone 200 MG tablet Commonly known as:  PACERONE Take 200 mg by mouth 2 (two) times daily.   amLODipine 5 MG tablet Commonly known as:  NORVASC Take 5 mg by mouth daily.   atorvastatin 20 MG tablet Commonly known as:  LIPITOR Take 20 mg by mouth at bedtime.   bisacodyl 5 MG EC tablet Commonly known as:  DULCOLAX Take 10 mg by mouth daily as needed  (constipation).   CALCIUM 600-D 600-400 MG-UNIT Tabs Generic drug:  Calcium Carbonate-Vitamin D3 Take 1 tablet by mouth 2 (two) times daily.   carvedilol 12.5 MG tablet Commonly known as:  COREG Take 12.5 mg by mouth 2 (two) times daily with a meal.   CoQ10 50 MG Caps Take 50 mg by mouth at bedtime.   docusate sodium 100 MG capsule Commonly known as:  COLACE Take 100 mg by mouth 2 (  two) times daily.   eucerin cream Apply 1 application topically See admin instructions. Apply to RLE and LLE topically every day shift for dry skin   feeding supplement (PRO-STAT SUGAR FREE 64) Liqd Take 30 mLs by mouth 2 (two) times daily with a meal.   ferrous sulfate 325 (65 FE) MG tablet Take 325 mg by mouth daily.   hydrALAZINE 25 MG tablet Commonly known as:  APRESOLINE Take 12.5 mg by mouth 2 (two) times daily.   isosorbide dinitrate 20 MG tablet Commonly known as:  ISORDIL Take 20 mg by mouth 3 (three) times daily.   levETIRAcetam 250 MG tablet Commonly known as:  KEPPRA Take 250 mg by mouth 2 (two) times daily. For epilepsy   lisinopril 2.5 MG tablet Commonly known as:  PRINIVIL,ZESTRIL Take 2.5 mg by mouth daily.   magnesium oxide 400 MG tablet Commonly known as:  MAG-OX Take 400 mg by mouth at bedtime.   multivitamin with minerals Tabs tablet Take 1 tablet by mouth daily.   nitroGLYCERIN 0.4 MG SL tablet Commonly known as:  NITROSTAT Place 0.4 mg under the tongue every 5 (five) minutes as needed for chest pain (max 3 doses, if no relief call MD).   oxyCODONE-acetaminophen 5-325 MG tablet Commonly known as:  PERCOCET/ROXICET Take 1 tablet by mouth See admin instructions. Give 1 tablet by mouth twice daily for pain management for two weeks - 11/15/17 - 11/29/17   PHYTOPLEX Z-GUARD 57-17 % Pste Generic drug:  Petrolatum-Zinc Oxide Apply 1 application topically See admin instructions. Apply to butt, bilateral thighs topically every day and evening shift for abrasions. Cleanse  buttocks and bilateral thighs. Pat Dry. Apply Z-guard to these areas. Do this until healed, then D/C.   potassium chloride SA 20 MEQ tablet Commonly known as:  K-DUR,KLOR-CON Take 20 mEq by mouth daily.   pregabalin 25 MG capsule Commonly known as:  LYRICA Take 25 mg by mouth daily.   sertraline 25 MG tablet Commonly known as:  ZOLOFT Take 25 mg by mouth daily.   SYSTANE 0.4-0.3 % Soln Generic drug:  Polyethyl Glycol-Propyl Glycol Place 1 drop into both eyes daily.   torsemide 20 MG tablet Commonly known as:  DEMADEX Take 40 mg by mouth daily.   VIMPAT 100 MG Tabs Generic drug:  Lacosamide Take 100 mg by mouth 2 (two) times daily.   vitamin C 500 MG tablet Commonly known as:  ASCORBIC ACID Take 500 mg by mouth daily.   VITAMIN D3 ULTRA POTENCY 16109 units Tabs Generic drug:  Cholecalciferol Take 50,000 Units by mouth every 30 (thirty) days. On or about the 5th of each month       Discharge Instructions: Please refer to Patient Instructions section of EMR for full details.  Patient was counseled important signs and symptoms that should prompt return to medical care, changes in medications, dietary instructions, activity restrictions, and follow up appointments.   Follow-Up Appointments:   Arlyce Harman, DO 11/22/2017, 3:06 PM PGY-1, [facility]

## 2017-11-23 LAB — TYPE AND SCREEN
ABO/RH(D): A POS
ANTIBODY SCREEN: NEGATIVE

## 2017-11-24 LAB — CULTURE, BLOOD (ROUTINE X 2)
CULTURE: NO GROWTH
Culture: NO GROWTH
Special Requests: ADEQUATE
Special Requests: ADEQUATE

## 2019-09-21 IMAGING — CT CT HEAD W/O CM
4 series · 17 of 47 positions shown, 19 images · non-contrast
Comparison: None.

CLINICAL DATA: Altered mental status worsening recently.

EXAM:
CT HEAD WITHOUT CONTRAST
TECHNIQUE: Contiguous axial images were obtained from the base of the skull
through the vertex without intravenous contrast.

[Series 3: head wo · axial · 0.36mm/px · z∈[-283,-163]mm · 7 of 33 slices shown, 9 images]
[im 5/33  brain]
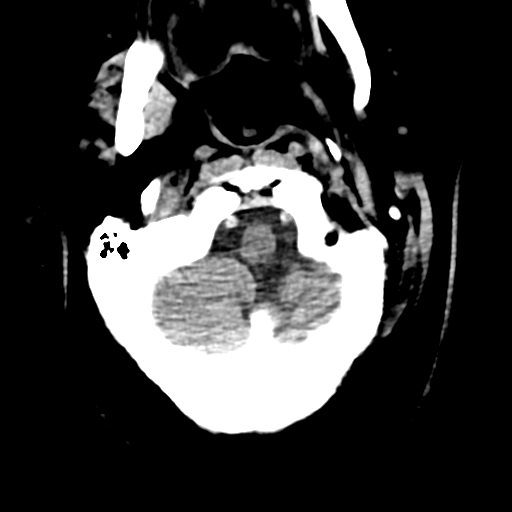
[im 5/33  bone]
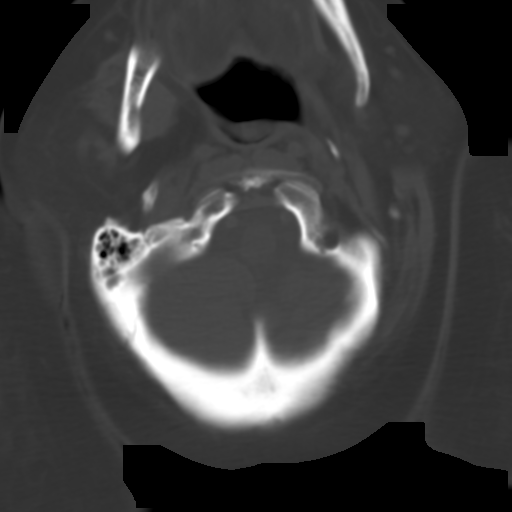
[im 9/33  brain]
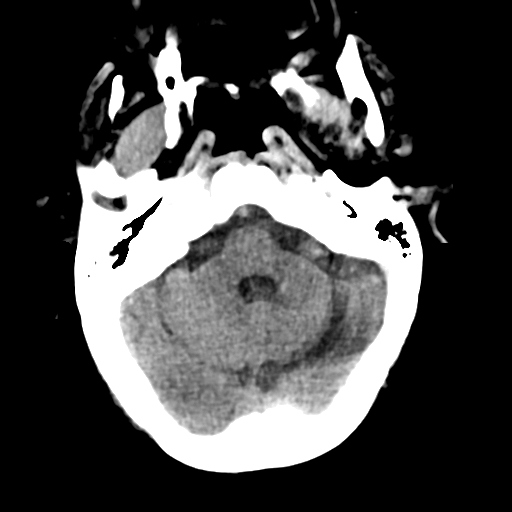
[im 13/33  brain]
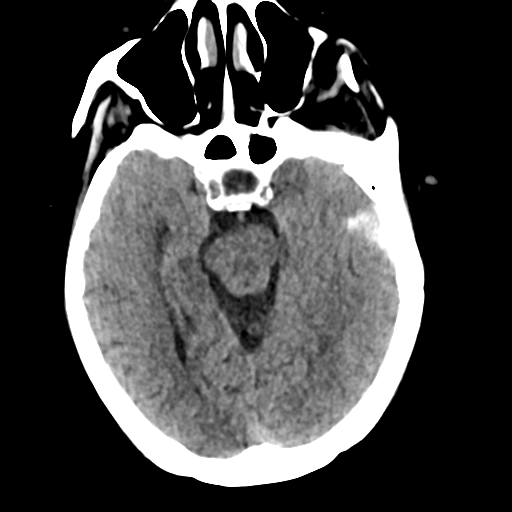
[im 17/33  brain]
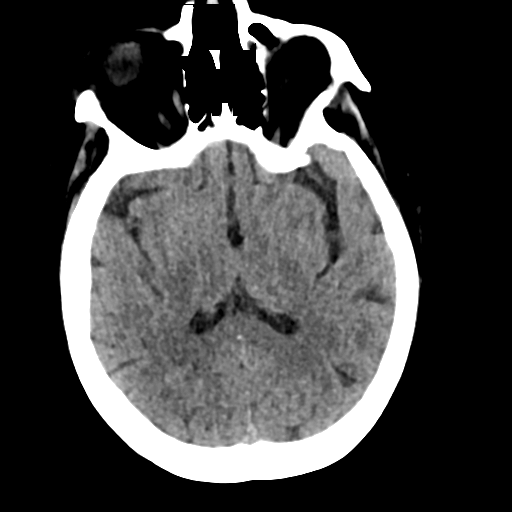
[im 21/33  brain]
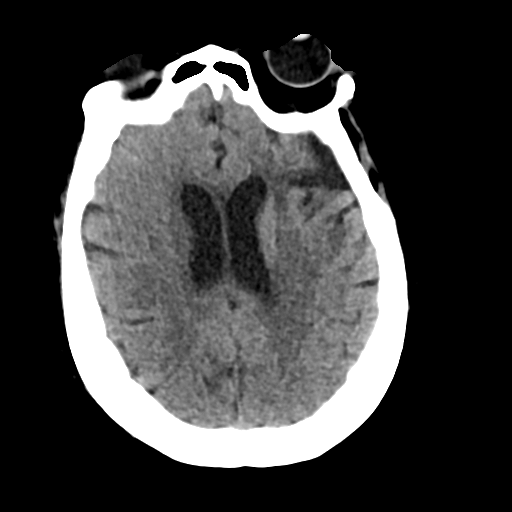
[im 21/33  bone]
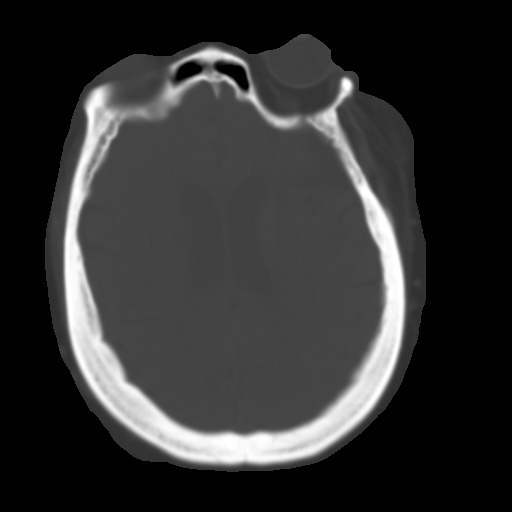
[im 25/33  brain]
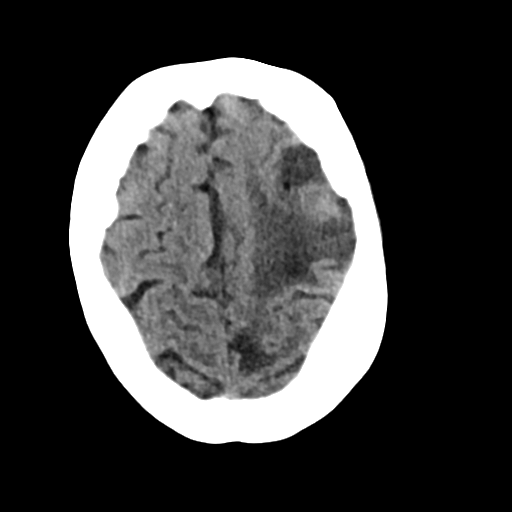
[im 29/33  brain]
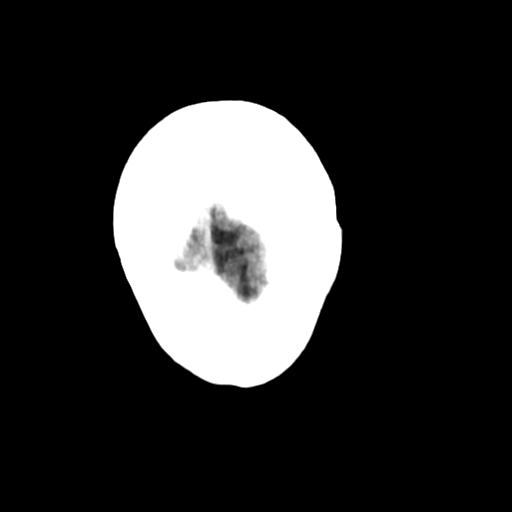

[Series 4: head bone · axial · 0.36mm/px · z∈[-287,-231]mm · 4 of 82 slices shown]
[im 9/82  bone]
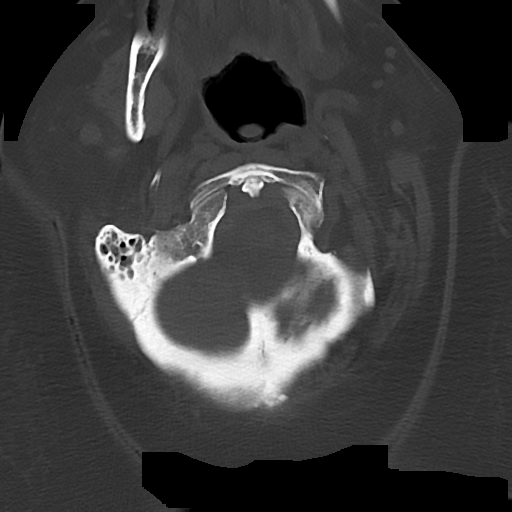
[im 17/82  bone]
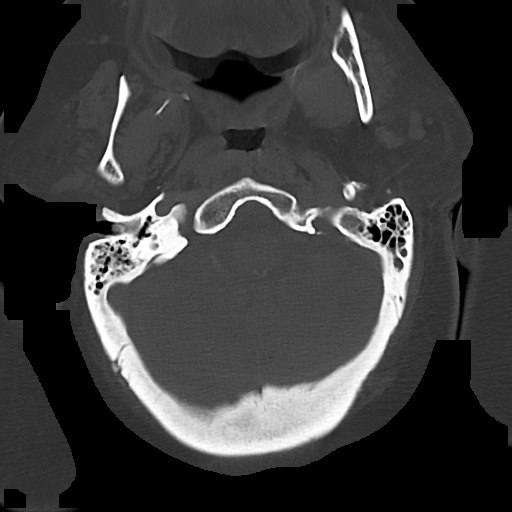
[im 25/82  bone]
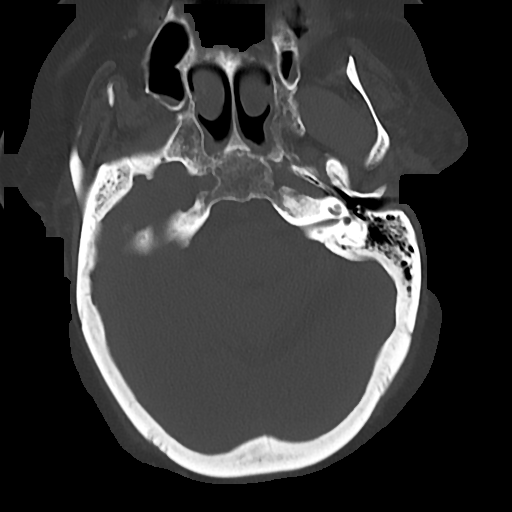
[im 37/82  bone]
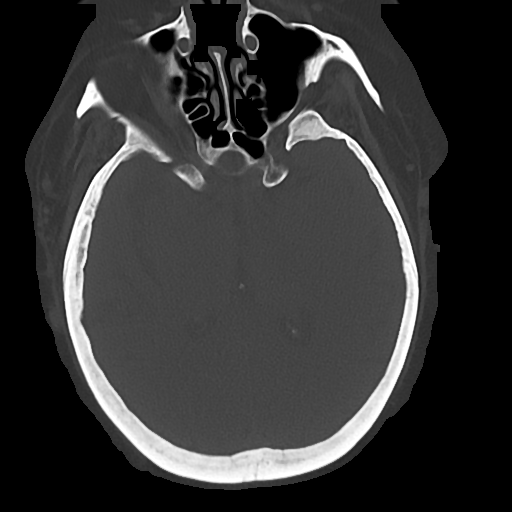

[Series 5: cor soft · coronal · 0.31mm/px · 3 of 67 slices shown]
[im 23/67  brain]
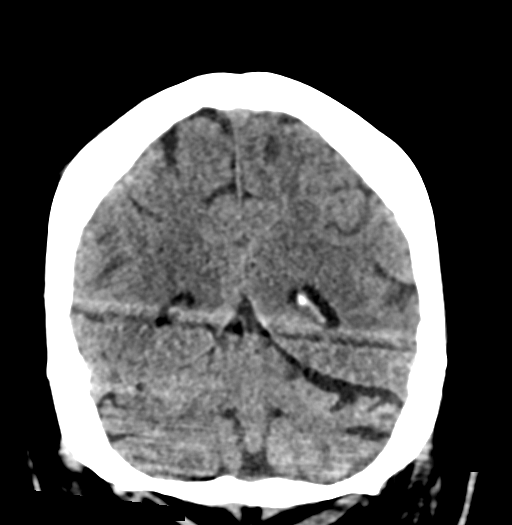
[im 30/67  brain]
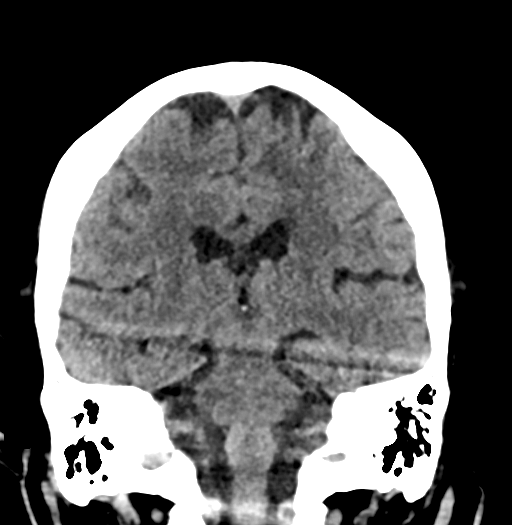
[im 37/67  brain]
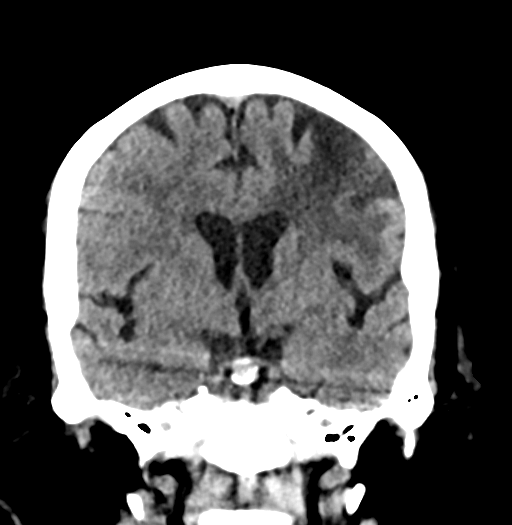

[Series 6: sag soft · sagittal · 0.32mm/px · 3 of 53 slices shown]
[im 18/53  brain]
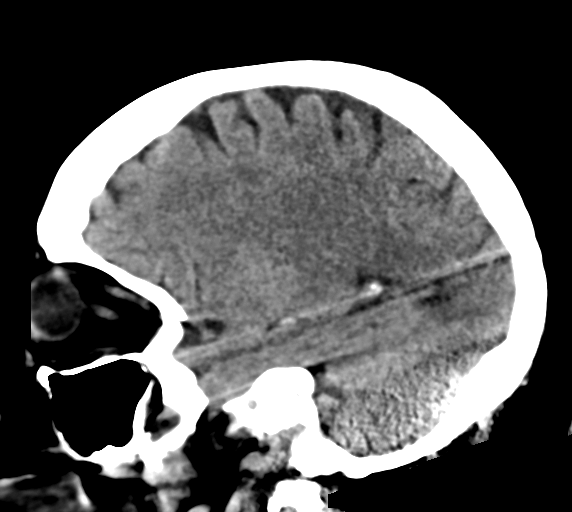
[im 27/53  brain]
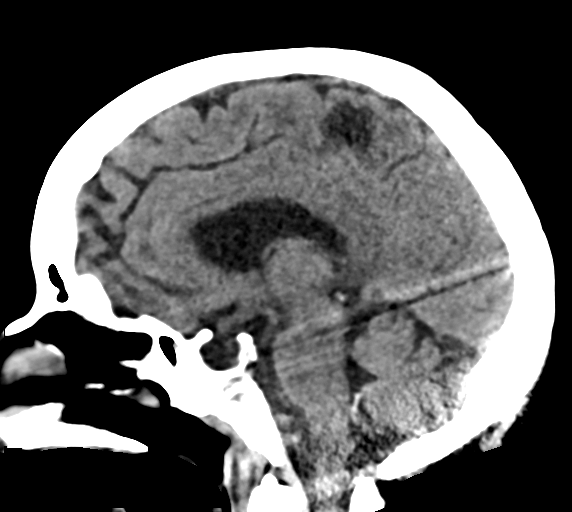
[im 35/53  brain]
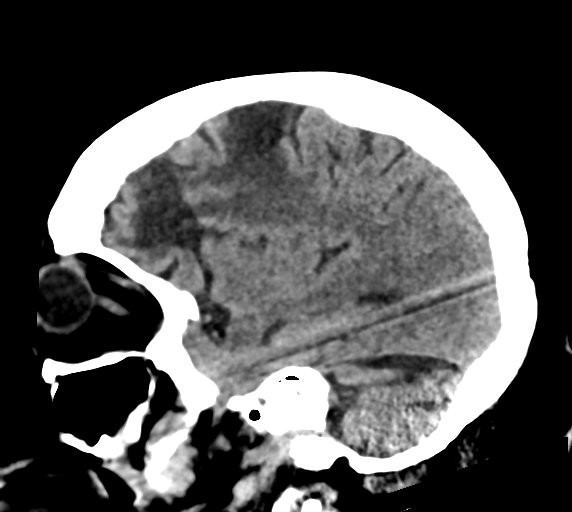

[17 of 47 positions shown; findings below may reference images not displayed]

FINDINGS: Brain: No abnormality is seen affecting the brainstem or cerebellum.
Right cerebral hemisphere appears normal. Left cerebral hemisphere
shows old appearing infarctions in the MCA territory affecting the
frontal and parietal regions, with atrophy, encephalomalacia and
gliosis. No acute component identified. No sign of hemorrhage or
mass effect. No hydrocephalus or extra-axial collection.

Vascular: There is atherosclerotic calcification of the major
vessels at the base of the brain.

Skull: Negative

Sinuses/Orbits: Clear/normal

Other: None
IMPRESSION: No acute finding by CT. Old infarctions in the left MCA territory as
described above.
